# Patient Record
Sex: Male | Born: 1965 | Race: White | Hispanic: No | Marital: Married | Smoking: Never smoker
Health system: Southern US, Community
[De-identification: ages and names within clinical notes are randomized; demographics above are authoritative.]

## PROBLEM LIST (undated history)

## (undated) DIAGNOSIS — I10 Essential (primary) hypertension: Secondary | ICD-10-CM

## (undated) DIAGNOSIS — E119 Type 2 diabetes mellitus without complications: Secondary | ICD-10-CM

## (undated) DIAGNOSIS — E785 Hyperlipidemia, unspecified: Secondary | ICD-10-CM

## (undated) DIAGNOSIS — N200 Calculus of kidney: Secondary | ICD-10-CM

## (undated) DIAGNOSIS — R7989 Other specified abnormal findings of blood chemistry: Secondary | ICD-10-CM

## (undated) HISTORY — DX: Essential (primary) hypertension: I10

## (undated) HISTORY — DX: Other specified abnormal findings of blood chemistry: R79.89

## (undated) HISTORY — DX: Calculus of kidney: N20.0

## (undated) HISTORY — DX: Type 2 diabetes mellitus without complications: E11.9

## (undated) HISTORY — DX: Hyperlipidemia, unspecified: E78.5

---

## 2003-07-20 HISTORY — PX: OTHER SURGICAL HISTORY: SHX169

## 2008-02-22 ENCOUNTER — Ambulatory Visit: Payer: Self-pay | Admitting: Maternal & Fetal Medicine

## 2012-12-14 ENCOUNTER — Ambulatory Visit: Payer: Self-pay | Admitting: Internal Medicine

## 2012-12-22 DIAGNOSIS — R339 Retention of urine, unspecified: Secondary | ICD-10-CM | POA: Insufficient documentation

## 2012-12-22 DIAGNOSIS — N400 Enlarged prostate without lower urinary tract symptoms: Secondary | ICD-10-CM | POA: Insufficient documentation

## 2012-12-22 DIAGNOSIS — R5383 Other fatigue: Secondary | ICD-10-CM | POA: Insufficient documentation

## 2012-12-22 DIAGNOSIS — E291 Testicular hypofunction: Secondary | ICD-10-CM | POA: Insufficient documentation

## 2013-07-31 DIAGNOSIS — E236 Other disorders of pituitary gland: Secondary | ICD-10-CM | POA: Insufficient documentation

## 2013-12-04 DIAGNOSIS — G4733 Obstructive sleep apnea (adult) (pediatric): Secondary | ICD-10-CM | POA: Insufficient documentation

## 2014-02-07 DIAGNOSIS — E221 Hyperprolactinemia: Secondary | ICD-10-CM | POA: Insufficient documentation

## 2014-02-07 DIAGNOSIS — N200 Calculus of kidney: Secondary | ICD-10-CM | POA: Insufficient documentation

## 2016-10-28 ENCOUNTER — Encounter: Payer: Self-pay | Admitting: *Deleted

## 2016-11-02 ENCOUNTER — Encounter: Payer: Self-pay | Admitting: *Deleted

## 2016-11-08 ENCOUNTER — Ambulatory Visit (INDEPENDENT_AMBULATORY_CARE_PROVIDER_SITE_OTHER): Payer: BC Managed Care – PPO | Admitting: General Surgery

## 2016-11-08 ENCOUNTER — Encounter: Payer: Self-pay | Admitting: General Surgery

## 2016-11-08 VITALS — BP 140/78 | HR 82 | Resp 14 | Ht 66.0 in | Wt 201.0 lb

## 2016-11-08 DIAGNOSIS — Z1211 Encounter for screening for malignant neoplasm of colon: Secondary | ICD-10-CM | POA: Diagnosis not present

## 2016-11-08 MED ORDER — POLYETHYLENE GLYCOL 3350 17 GM/SCOOP PO POWD: ORAL | 0 refills | Status: DC

## 2016-11-08 NOTE — Patient Instructions (Signed)
Colonoscopy, Adult A colonoscopy is an exam to look at the entire large intestine. During the exam, a lubricated, bendable tube is inserted into the anus and then passed into the rectum, colon, and other parts of the large intestine. A colonoscopy is often done as a part of normal colorectal screening or in response to certain symptoms, such as anemia, persistent diarrhea, abdominal pain, and blood in the stool. The exam can help screen for and diagnose medical problems, including:  Tumors.  Polyps.  Inflammation.  Areas of bleeding. Tell a health care provider about:  Any allergies you have.  All medicines you are taking, including vitamins, herbs, eye drops, creams, and over-the-counter medicines.  Any problems you or family members have had with anesthetic medicines.  Any blood disorders you have.  Any surgeries you have had.  Any medical conditions you have.  Any problems you have had passing stool. What are the risks? Generally, this is a safe procedure. However, problems may occur, including:  Bleeding.  A tear in the intestine.  A reaction to medicines given during the exam.  Infection (rare). What happens before the procedure? Eating and drinking restrictions  Follow instructions from your health care provider about eating and drinking, which may include:  A few days before the procedure - follow a low-fiber diet. Avoid nuts, seeds, dried fruit, raw fruits, and vegetables.  1-3 days before the procedure - follow a clear liquid diet. Drink only clear liquids, such as clear broth or bouillon, black coffee or tea, clear juice, clear soft drinks or sports drinks, gelatin dessert, and popsicles. Avoid any liquids that contain red or purple dye.  On the day of the procedure - do not eat or drink anything during the 2 hours before the procedure, or within the time period that your health care provider recommends. Bowel prep  If you were prescribed an oral bowel prep to  clean out your colon:  Take it as told by your health care provider. Starting the day before your procedure, you will need to drink a large amount of medicated liquid. The liquid will cause you to have multiple loose stools until your stool is almost clear or light green.  If your skin or anus gets irritated from diarrhea, you may use these to relieve the irritation:  Medicated wipes, such as adult wet wipes with aloe and vitamin E.  A skin soothing-product like petroleum jelly.  If you vomit while drinking the bowel prep, take a break for up to 60 minutes and then begin the bowel prep again. If vomiting continues and you cannot take the bowel prep without vomiting, call your health care provider. General instructions   Ask your health care provider about changing or stopping your regular medicines. This is especially important if you are taking diabetes medicines or blood thinners.  Plan to have someone take you home from the hospital or clinic. What happens during the procedure?  An IV tube may be inserted into one of your veins.  You will be given medicine to help you relax (sedative).  To reduce your risk of infection:  Your health care team will wash or sanitize their hands.  Your anal area will be washed with soap.  You will be asked to lie on your side with your knees bent.  Your health care provider will lubricate a long, thin, flexible tube. The tube will have a camera and a light on the end.  The tube will be inserted into your anus.    The tube will be gently eased through your rectum and colon.  Air will be delivered into your colon to keep it open. You may feel some pressure or cramping.  The camera will be used to take images during the procedure.  A small tissue sample may be removed from your body to be examined under a microscope (biopsy). If any potential problems are found, the tissue will be sent to a lab for testing.  If small polyps are found, your health  care provider may remove them and have them checked for cancer cells.  The tube that was inserted into your anus will be slowly removed. The procedure may vary among health care providers and hospitals. What happens after the procedure?  Your blood pressure, heart rate, breathing rate, and blood oxygen level will be monitored until the medicines you were given have worn off.  Do not drive for 24 hours after the exam.  You may have a small amount of blood in your stool.  You may pass gas and have mild abdominal cramping or bloating due to the air that was used to inflate your colon during the exam.  It is up to you to get the results of your procedure. Ask your health care provider, or the department performing the procedure, when your results will be ready. This information is not intended to replace advice given to you by your health care provider. Make sure you discuss any questions you have with your health care provider. Document Released: 07/02/2000 Document Revised: 05/05/2016 Document Reviewed: 09/16/2015 Elsevier Interactive Patient Education  2017 [employer]vier Inc.  

## 2016-11-08 NOTE — Progress Notes (Signed)
Patient ID: Jeffrey Pace, male   DOB: 12-29-1965, 51 y.o.   MRN: [id number]  Chief Complaint  Patient presents with  . Colonoscopy    HPI Jeffrey Pace is a 51 y.o. male here for a colonoscopy discussion. He reports no GI problems. He moves his bowels daily. He has never had a colonoscopy before.  [name] is his uncle.  HPI  Past Medical History:  Diagnosis Date  . Diabetes mellitus (HCC)   . Hyperlipidemia   . Hypertension   . Kidney stones   . Low testosterone in male     Past Surgical History:  Procedure Laterality Date  . broken foot Left 2005   repair with rods    Family History  Problem Relation Age of Onset  . Lung cancer Mother 37  . Breast cancer Mother 85  . Hypertension Father   . Rectal cancer Maternal Uncle 64    Social History Social History  Substance Use Topics  . Smoking status: Never Smoker  . Smokeless tobacco: Never Used  . Alcohol use 2.4 oz/week    4 Glasses of wine per week    Allergies  Allergen Reactions  . Demerol [Meperidine] Nausea And Vomiting    Current Outpatient Prescriptions  Medication Sig Dispense Refill  . atorvastatin (LIPITOR) 20 MG tablet Take 20 mg by mouth daily.    . Cholecalciferol (VITAMIN D3) 10000 units TABS Take by mouth.    Marland Kitchen glipiZIDE (GLUCOTROL XL) 2.5 MG 24 hr tablet Take 2.5 mg by mouth daily with breakfast.    . losartan-hydrochlorothiazide (HYZAAR) 100-25 MG tablet Take 1 tablet by mouth daily.    . metFORMIN (GLUMETZA) 1000 MG (MOD) 24 hr tablet Take 1,000 mg by mouth daily with breakfast.    . testosterone cypionate (DEPO-TESTOSTERONE) 200 MG/ML injection Inject 200 mg into the muscle every 14 (fourteen) days.    . polyethylene glycol powder (GLYCOLAX/MIRALAX) powder 255 grams one bottle for colonoscopy prep 255 g 0   No current facility-administered medications for this visit.     Review of Systems Review of Systems  Constitutional: Negative.   Respiratory: Negative.   Cardiovascular:  Negative.     Blood pressure 140/78, pulse 82, resp. rate 14, height  (1.676 m), weight 201 lb (91.2 kg).  Physical Exam Physical Exam  Constitutional: He is oriented to person, place, and time. He appears well-developed and well-nourished.  Cardiovascular: Normal rate, regular rhythm and normal heart sounds.   Pulmonary/Chest: Effort normal and breath sounds normal.  Neurological: He is alert and oriented to person, place, and time.  Skin: Skin is warm and dry.    Data Reviewed PCP laboratory studies dated 08/03/2016 were reviewed. Hemoglobin 17.4, MCV 90, white blood cell count 6200, platelet count 213,000. Creatinine is 1.2 with an estimated GFR of 64. Stable for the last 2 years. Hemoglobin A1c 6.8.  Assessment    Candidate for screening colonoscopy.    Plan        Colonoscopy with possible biopsy/polypectomy prn: Information regarding the procedure, including its potential risks and complications (including but not limited to perforation of the bowel, which may require emergency surgery to repair, and bleeding) was verbally given to the patient. Educational information regarding lower intestinal endoscopy was given to the patient. Written instructions for how to complete the bowel prep using Miralax were provided. The importance of drinking ample fluids to avoid dehydration as a result of the prep emphasized.  HPI, Physical Exam, Assessment and Plan have  been scribed under the direction and in the presence of Donnalee Curry, MD.  Ples Specter, CMA  I have completed the exam and reviewed the above documentation for accuracy and completeness.  I agree with the above.  Museum/gallery conservator has been used and any errors in dictation or transcription are unintentional.  Donnalee Curry, M.D., F.A.C.S.  Earline Mayotte 11/09/2016, 8:12 AM   Patient has been scheduled for a colonoscopy on 01-05-17 at [facility]. This patient has been asked to hold metformin day of colonoscopy  prep and procedure. Miralax prescription has been sent in to the patient's pharmacy today. Colonoscopy instructions have been reviewed with the patient. This patient is aware to call the office if they have further questions.   Nicholes Mango, CMA

## 2016-11-09 ENCOUNTER — Encounter: Payer: Self-pay | Admitting: General Surgery

## 2016-11-09 DIAGNOSIS — Z1211 Encounter for screening for malignant neoplasm of colon: Secondary | ICD-10-CM | POA: Insufficient documentation

## 2016-12-29 ENCOUNTER — Telehealth: Payer: Self-pay | Admitting: *Deleted

## 2016-12-29 NOTE — Telephone Encounter (Signed)
Patient was contacted today and confirms no medication changes since his last office visit.   This patient reports that he has picked up Miralax prescription.  We will proceed with colon[facility] as [facility].   Patient was encouraged to call the office should he have further questions.

## 2017-01-05 ENCOUNTER — Ambulatory Visit
Admission: RE | Admit: 2017-01-05 | Discharge: 2017-01-05 | Disposition: A | Discharge status: Home / Self Care | Payer: BC Managed Care – PPO | Source: Ambulatory Visit | Attending: General Surgery | Admitting: General Surgery

## 2017-01-05 ENCOUNTER — Encounter
Admission: RE | Disposition: A | Discharge status: Home / Self Care | Payer: Self-pay | Source: Ambulatory Visit | Attending: General Surgery

## 2017-01-05 ENCOUNTER — Ambulatory Visit: Payer: BC Managed Care – PPO | Admitting: Anesthesiology

## 2017-01-05 DIAGNOSIS — E785 Hyperlipidemia, unspecified: Secondary | ICD-10-CM | POA: Diagnosis not present

## 2017-01-05 DIAGNOSIS — E119 Type 2 diabetes mellitus without complications: Secondary | ICD-10-CM | POA: Insufficient documentation

## 2017-01-05 DIAGNOSIS — Z1211 Encounter for screening for malignant neoplasm of colon: Secondary | ICD-10-CM | POA: Insufficient documentation

## 2017-01-05 DIAGNOSIS — Z79899 Other long term (current) drug therapy: Secondary | ICD-10-CM | POA: Insufficient documentation

## 2017-01-05 DIAGNOSIS — Z7982 Long term (current) use of aspirin: Secondary | ICD-10-CM | POA: Insufficient documentation

## 2017-01-05 DIAGNOSIS — Z7984 Long term (current) use of oral hypoglycemic drugs: Secondary | ICD-10-CM | POA: Insufficient documentation

## 2017-01-05 DIAGNOSIS — K5730 Diverticulosis of large intestine without perforation or abscess without bleeding: Secondary | ICD-10-CM | POA: Insufficient documentation

## 2017-01-05 DIAGNOSIS — I10 Essential (primary) hypertension: Secondary | ICD-10-CM | POA: Insufficient documentation

## 2017-01-05 HISTORY — PX: COLONOSCOPY WITH PROPOFOL: SHX5780

## 2017-01-05 LAB — GLUCOSE, CAPILLARY: GLUCOSE-CAPILLARY: 141 mg/dL — AB (ref 65–99)

## 2017-01-05 SURGERY — COLONOSCOPY WITH PROPOFOL
Anesthesia: General

## 2017-01-05 MED ORDER — SODIUM CHLORIDE 0.9 % IV SOLN
INTRAVENOUS | Status: DC
  Administered 2017-01-05: 1000 mL via INTRAVENOUS

## 2017-01-05 MED ORDER — PROPOFOL 500 MG/50ML IV EMUL
INTRAVENOUS | Status: AC
  Filled 2017-01-05: qty 50

## 2017-01-05 MED ORDER — PROPOFOL 500 MG/50ML IV EMUL
INTRAVENOUS | Status: DC | PRN
  Administered 2017-01-05: 120 ug/kg/min via INTRAVENOUS

## 2017-01-05 MED ORDER — PROPOFOL 10 MG/ML IV BOLUS
INTRAVENOUS | Status: DC | PRN
  Administered 2017-01-05: 100 mg via INTRAVENOUS

## 2017-01-05 MED ORDER — LIDOCAINE HCL (PF) 2 % IJ SOLN
INTRAMUSCULAR | Status: DC | PRN
  Administered 2017-01-05: 50 mg via INTRADERMAL

## 2017-01-05 MED ORDER — PHENYLEPHRINE HCL 10 MG/ML IJ SOLN
INTRAMUSCULAR | Status: DC | PRN
  Administered 2017-01-05: 100 ug via INTRAVENOUS

## 2017-01-05 NOTE — Anesthesia Postprocedure Evaluation (Signed)
Anesthesia Post Note  Patient: Jeffrey KoyanagiMichael T Pace  Procedure(s) Performed: Procedure(s) (LRB): COLONOSCOPY WITH PROPOFOL (N/A)  Patient location during evaluation: PACU Anesthesia Type: General Level of consciousness: awake Pain management: pain level controlled Vital Signs Assessment: post-procedure vital signs reviewed and stable Respiratory status: spontaneous breathing Cardiovascular status: stable Anesthetic complications: no     Last Vitals:  Vitals:   01/05/17 1030 01/05/17 1040  BP: 117/73 100/80  Pulse: 81 80  Resp: (!) 23 18  Temp:      Last Pain:  Vitals:   01/05/17 1010  TempSrc: Tympanic                 VAN STAVEREN,Averill Pons

## 2017-01-05 NOTE — H&P (Signed)
Jeffrey KoyanagiMichael T Pace [id number] 04-11-1966     HPI: 51 year old male for screening colonoscopy. He reports tolerating the prep well. Preprocedure blood sugar: 141.  Prescriptions Prior to Admission  Medication Sig Dispense Refill Last Dose  . aspirin EC 81 MG tablet Take 81 mg by mouth daily.   Past Week at Unknown time  . atorvastatin (LIPITOR) 20 MG tablet Take 20 mg by mouth daily.   Past Week at Unknown time  . Cholecalciferol (VITAMIN D3) 10000 units TABS Take by mouth.   Past Week at Unknown time  . glipiZIDE (GLUCOTROL XL) 2.5 MG 24 hr tablet Take 2.5 mg by mouth daily with breakfast.   Past Week at Unknown time  . losartan-hydrochlorothiazide (HYZAAR) 100-25 MG tablet Take 1 tablet by mouth daily.   01/05/2017 at 0745  . metFORMIN (GLUMETZA) 1000 MG (MOD) 24 hr tablet Take 1,000 mg by mouth daily with breakfast.   Past Week at Unknown time  . polyethylene glycol powder (GLYCOLAX/MIRALAX) powder 255 grams one bottle for colonoscopy prep 255 g 0 Past Week at Unknown time  . testosterone cypionate (DEPO-TESTOSTERONE) 200 MG/ML injection Inject 200 mg into the muscle every 14 (fourteen) days.   Taking   Allergies  Allergen Reactions  . Demerol [Meperidine] Nausea And Vomiting   Past Medical History:  Diagnosis Date  . Diabetes mellitus (HCC)   . Hyperlipidemia   . Hypertension   . Kidney stones   . Low testosterone in male    Past Surgical History:  Procedure Laterality Date  . broken foot Left 2005   repair with rods   Social History   Social History  . Marital status: Married    Spouse name: N/A  . Number of children: N/A  . Years of education: N/A   Occupational History  . Not on file.   Social History Main Topics  . Smoking status: Never Smoker  . Smokeless tobacco: Never Used  . Alcohol use 2.4 oz/week    4 Glasses of wine per week  . Drug use: No  . Sexual activity: Not on file   Other Topics Concern  . Not on file   Social History Narrative  . No  narrative on file   Social History   Social History Narrative  . No narrative on file     ROS: Negative.     PE: HEENT: Negative. Lungs: Clear. Cardio: RR.  Assessment/Plan:  Proceed with planned endoscopy.  Earline MayotteByrnett, Najat Olazabal W 01/05/2017

## 2017-01-05 NOTE — Anesthesia Preprocedure Evaluation (Signed)
Anesthesia Evaluation  Patient identified by MRN, date of birth, ID band Patient awake    Reviewed: Allergy & Precautions, NPO status , Patient's Chart, lab work & pertinent test results  Airway Mallampati: II       Dental  (+) Teeth Intact   Pulmonary neg pulmonary ROS,    breath sounds clear to auscultation       Cardiovascular Exercise Tolerance: Good hypertension, Pt. on medications  Rhythm:Regular Rate:Normal     Neuro/Psych negative neurological ROS  negative psych ROS   GI/Hepatic negative GI ROS, Neg liver ROS,   Endo/Other  diabetes, Type 2, Oral Hypoglycemic Agents  Renal/GU      Musculoskeletal negative musculoskeletal ROS (+)   Abdominal (+) + obese,   Peds negative pediatric ROS (+)  Hematology negative hematology ROS (+)   Anesthesia Other Findings   Reproductive/Obstetrics                             Anesthesia Physical Anesthesia Plan  ASA: II  Anesthesia Plan: General   Post-op Pain Management:    Induction: Intravenous  PONV Risk Score and Plan: 0  Airway Management Planned: Natural Airway and Nasal Cannula  Additional Equipment:   Intra-op Plan:   Post-operative Plan:   Informed Consent: I have reviewed the patients History and Physical, chart, labs and discussed the procedure including the risks, benefits and alternatives for the proposed anesthesia with the patient or authorized representative who has indicated his/her understanding and acceptance.     Plan Discussed with: CRNA  Anesthesia Plan Comments:         Anesthesia Quick Evaluation

## 2017-01-05 NOTE — Transfer of Care (Signed)
Immediate Anesthesia Transfer of Care Note  Patient: Jeffrey KoyanagiMichael T Pace  Procedure(s) Performed: Procedure(s): COLONOSCOPY WITH PROPOFOL (N/A)  Patient Location: PACU  Anesthesia Type:General  Level of Consciousness: drowsy  Airway & Oxygen Therapy: Patient connected to nasal cannula oxygen  Post-op Assessment: Report given to RN and Post -op Vital signs reviewed and stable  Post vital signs: Reviewed and stable  Last Vitals:  Vitals:   01/05/17 0900 01/05/17 1010  BP: 129/80 (!) 89/54  Pulse: 92 75  Resp: 16 16  Temp: 36.9 C (!) 35.7 C    Last Pain:  Vitals:   01/05/17 1010  TempSrc: Tympanic         Complications: No apparent anesthesia complications

## 2017-01-05 NOTE — Op Note (Signed)
[facility] Gastroenterology Patient Name: Jeffrey MariaMichael Matsen Procedure Date: 01/05/2017 9:40 AM MRN: [id number] Account #: [id number] Date of Birth: Sep 23, 1965 Admit Type: Outpatient Age: [age] Room[facility] ENDO ROOM 1 Gender: Male Note Status: Finalized Procedure:            Colonoscopy Indications:          Screening for colorectal malignant neoplasm Providers:            Earline MayotteJeffrey W. Ervin Rothbauer, MD Referring MD:         Hassell HalimMarcus E. Babaoff MD (Referring MD) Medicines:            Monitored Anesthesia Care Complications:        No immediate complications. Procedure:            Pre-Anesthesia Assessment:                       - Prior to the procedure, a History and Physical was                        performed, and patient medications, allergies and                        sensitivities were reviewed. The patient's tolerance of                        previous anesthesia was reviewed.                       - The risks and benefits of the procedure and the                        sedation options and risks were discussed with the                        patient. All questions were answered and informed                        consent was obtained.                       After obtaining informed consent, the colonoscope was                        passed under direct vision. Throughout the procedure,                        the patient's blood pressure, pulse, and oxygen                        saturations were monitored continuously. The                        Colonoscope was introduced through the anus and                        advanced to the the cecum, identified by appendiceal                        orifice and ileocecal valve. The colonoscopy was  somewhat difficult due to significant looping.                        Successful completion of the procedure was aided by                        using manual pressure. The patient tolerated the   procedure well. The quality of the bowel preparation                        was excellent. Findings:      Many small-mouthed diverticula were found in the sigmoid colon and       descending colon.      The retroflexed view of the distal rectum and anal verge was normal and       showed no anal or rectal abnormalities. Impression:           - Diverticulosis in the sigmoid colon and in the                        descending colon.                       - The distal rectum and anal verge are normal on                        retroflexion view.                       - No specimens collected. Recommendation:       - Repeat colonoscopy in 10 years for screening purposes.                       - Use fiber, for example Citrucel, Fibercon, Konsyl or                        Metamucil. Procedure Code(s):    --- Professional ---                       [id number], Colonoscopy, flexible; diagnostic, including                        collection of specimen(s) by brushing or washing, when                        performed (separate procedure) Diagnosis Code(s):    --- Professional ---                       Z12.11, Encounter for screening for malignant neoplasm                        of colon                       K57.30, Diverticulosis of large intestine without                        perforation or abscess without bleeding CPT copyright 2016 [employer]. All rights reserved. The codes documented in this report are preliminary and upon coder review may  be revised to meet current compliance requirements. Earline Mayotte, MD 01/05/2017  10:08:09 AM This report has been signed electronically. Number of Addenda: 0 Note Initiated On: 01/05/2017 9:40 AM Scope Withdrawal Time: 0 hours 10 minutes 38 seconds  Total Procedure Duration: 0 hours 17 minutes 25 seconds       [facility]

## 2017-01-05 NOTE — Anesthesia Post-op Follow-up Note (Cosign Needed)
Anesthesia QCDR form completed.        

## 2017-01-06 ENCOUNTER — Encounter: Payer: Self-pay | Admitting: General Surgery

## 2018-02-08 ENCOUNTER — Encounter

## 2018-02-08 ENCOUNTER — Ambulatory Visit: Payer: BC Managed Care – PPO | Admitting: Urology

## 2018-02-08 ENCOUNTER — Encounter: Payer: Self-pay | Admitting: Urology

## 2018-02-08 VITALS — BP 134/88 | HR 92 | Ht 66.0 in | Wt 191.2 lb

## 2018-02-08 DIAGNOSIS — E291 Testicular hypofunction: Secondary | ICD-10-CM

## 2018-02-08 NOTE — Progress Notes (Signed)
02/08/2018 12:58 PM   Jeffrey Pace Dec 17, 1965 [id number]  Referring provider: Kandyce Rud, MD [phone] [address] [facility] [place], [place] [address]  Chief Complaint  Patient presents with  . Establish Care    h/o kidney stones  . Hypogonadism    HPI: 52 year old male who presents to ED establish local urologic care.  He has previously seen Dr. Achilles Dunk for hypogonadism and erectile dysfunction.  He last saw him at [facility] in December 2018.  He is currently injecting 200 mg of testosterone cypionate every 2 weeks.  His last injection was on 01/27/2018.  He denies breast tenderness/enlargement or lower extremity edema.  He notes good energy level and libido.  He has no bothersome lower urinary tract symptoms.  At his last blood work in December 2018 his PSA had bumped from 1.34-2.86.  He is using generic sildenafil for erectile dysfunction with good efficacy.   PMH: Past Medical History:  Diagnosis Date  . Diabetes mellitus (HCC)   . Hyperlipidemia   . Hypertension   . Kidney stones   . Low testosterone in male     Surgical History: Past Surgical History:  Procedure Laterality Date  . broken foot Left 2005   repair with rods  . COLONOSCOPY WITH PROPOFOL N/A 01/05/2017   Procedure: COLONOSCOPY WITH PROPOFOL;  Surgeon: Earline Mayotte, MD;  Location: [facility] ENDOSCOPY;  Service: Endoscopy;  Laterality: N/A;    Home Medications:  Allergies as of 02/08/2018      Reactions   Demerol [meperidine] Nausea And Vomiting      Medication List        Accurate as of 02/08/18 12:58 PM. Always use your most recent med list.          aspirin EC 81 MG tablet Take 81 mg by mouth daily.   atorvastatin 20 MG tablet Commonly known as:  LIPITOR Take 20 mg by mouth daily.   DEPO-TESTOSTERONE 200 MG/ML injection Generic drug:  testosterone cypionate Inject 200 mg into the muscle every 14 (fourteen) days.   glipiZIDE 2.5 MG 24 hr  tablet Commonly known as:  GLUCOTROL XL Take 2.5 mg by mouth daily with breakfast.   losartan-hydrochlorothiazide 100-25 MG tablet Commonly known as:  HYZAAR Take 1 tablet by mouth daily.   metFORMIN 1000 MG (MOD) 24 hr tablet Commonly known as:  GLUMETZA Take 1,000 mg by mouth daily with breakfast.   polyethylene glycol powder powder Commonly known as:  GLYCOLAX/MIRALAX 255 grams one bottle for colonoscopy prep   sildenafil 20 MG tablet Commonly known as:  REVATIO TAKE 3-5 TABLETS BY MOUTH DAILY AS NEEDED   Vitamin D3 10000 units Tabs Take by mouth.       Allergies:  Allergies  Allergen Reactions  . Demerol [Meperidine] Nausea And Vomiting    Family History: Family History  Problem Relation Age of Onset  . Lung cancer Mother 59  . Breast cancer Mother 81  . Hypertension Father   . Rectal cancer Maternal Uncle 47    Social History:  reports that he has never smoked. He has never used smokeless tobacco. He reports that he drinks about 2.4 oz of alcohol per week. He reports that he does not use drugs.  ROS: UROLOGY Frequent Urination?: No Hard to postpone urination?: No Burning/pain with urination?: No Get up at night to urinate?: No Leakage of urine?: No Urine stream starts and stops?: No Trouble starting stream?: No Do you have to strain  to urinate?: No Blood in urine?: No Urinary tract infection?: No Sexually transmitted disease?: No Injury to kidneys or bladder?: No Painful intercourse?: No Weak stream?: No Erection problems?: Yes Penile pain?: No  Gastrointestinal Nausea?: No Vomiting?: No Indigestion/heartburn?: No Diarrhea?: No Constipation?: No  Constitutional Fever: No Night sweats?: No Weight loss?: No Fatigue?: No  Skin Skin rash/lesions?: No Itching?: No  Eyes Blurred vision?: No Double vision?: No  Ears/Nose/Throat Sore throat?: No Sinus problems?: No  Hematologic/Lymphatic Swollen glands?: No Easy bruising?:  No  Cardiovascular Leg swelling?: No Chest pain?: No  Respiratory Cough?: No Shortness of breath?: No  Endocrine Excessive thirst?: No  Musculoskeletal Back pain?: Yes Joint pain?: Yes  Neurological Headaches?: No Dizziness?: No  Psychologic Depression?: No Anxiety?: No  Physical Exam: BP 134/88 (BP Location: Left Arm, Patient Position: Sitting, Cuff Size: Large)   Pulse 92   Ht 5\' 6"  (1.676 m)   Wt 191 lb 3.2 oz (86.7 kg)   BMI 30.86 kg/m   Constitutional:  Alert and oriented, No acute distress. HEENT: [place] AT, moist mucus membranes.  Trachea midline, no masses. Cardiovascular: No clubbing, cyanosis, or edema. Respiratory: Normal respiratory effort, no increased work of breathing. GI: Abdomen is soft, nontender, nondistended, no abdominal masses GU: No CVA tenderness.  Prostate 30 g, smooth without nodules. Lymph: No cervical or inguinal lymphadenopathy. Skin: No rashes, bruises or suspicious lesions. Neurologic: Grossly intact, no focal deficits, moving all 4 extremities. Psychiatric: Normal mood and affect.   Assessment & Plan:   52 year old male with hypogonadism on TRT with symptomatic improvement.  He desires to continue.  Monitoring blood work was drawn including a PSA, testosterone and hematocrit.  His most recent PSA had a significant bump and is still abnormal would recommend further evaluation with either biopsy or prostate MRI.  Stable erectile dysfunction on sildenafil.  Return in about 1 year (around 02/09/2019) for Recheck.   Riki AltesScott C Stoioff, MD  [facility] [address], [address] [place], [place] [address] [phone]

## 2018-02-09 LAB — TESTOSTERONE: Testosterone: 517 ng/dL (ref 264–916)

## 2018-02-09 LAB — PSA: PROSTATE SPECIFIC AG, SERUM: 1.7 ng/mL (ref 0.0–4.0)

## 2018-02-09 LAB — HEMATOCRIT: HEMATOCRIT: 54 % — AB (ref 37.5–51.0)

## 2018-02-13 ENCOUNTER — Telehealth: Payer: Self-pay

## 2018-02-13 NOTE — Telephone Encounter (Signed)
Pt informed, please call and schedule hematocrit and testosterone for 2 months.

## 2018-02-13 NOTE — Telephone Encounter (Signed)
-----   Message from Riki AltesScott C Stoioff, MD sent at 02/12/2018  9:51 AM EDT ----- Testosterone level was 517.  PSA back to baseline at 1.7.  Hematocrit is elevated at 54%.  Would recommend he donate blood.  Repeat hematocrit and testosterone level 2 months

## 2018-02-16 ENCOUNTER — Telehealth: Payer: Self-pay | Admitting: Urology

## 2018-02-16 NOTE — Telephone Encounter (Signed)
App made patient has been notified  Jeffrey Pace

## 2018-04-17 ENCOUNTER — Other Ambulatory Visit: Payer: Self-pay | Admitting: Family Medicine

## 2018-04-17 DIAGNOSIS — E291 Testicular hypofunction: Secondary | ICD-10-CM

## 2018-04-19 ENCOUNTER — Other Ambulatory Visit: Payer: BC Managed Care – PPO

## 2018-04-19 DIAGNOSIS — E291 Testicular hypofunction: Secondary | ICD-10-CM

## 2018-04-20 LAB — HEMATOCRIT: Hematocrit: 51.3 % — ABNORMAL HIGH (ref 37.5–51.0)

## 2018-04-20 LAB — TESTOSTERONE: TESTOSTERONE: 535 ng/dL (ref 264–916)

## 2018-04-21 ENCOUNTER — Telehealth: Payer: Self-pay | Admitting: Family Medicine

## 2018-04-21 NOTE — Telephone Encounter (Signed)
-----   Message from Riki Altes, MD sent at 04/21/2018  7:52 AM EDT ----- Hematocrit still elevated at 51.3.  Did he donate blood?.  Testosterone level was 535.

## 2018-04-21 NOTE — Telephone Encounter (Signed)
Patient notified, he states he has not been able to donate blood. The drives are at inconvenient times for his work schedule, he states he will continue to look.

## 2018-04-21 NOTE — Telephone Encounter (Signed)
Patient notified and he states he will just call us back if he can't get it done in the next couple weeks. He states he will be out of town next week.

## 2018-04-21 NOTE — Telephone Encounter (Signed)
I will not be able to refill his testosterone until his hematocrit is at an acceptable level.  We can refer him to hematology for phlebotomy.

## 2018-05-16 ENCOUNTER — Other Ambulatory Visit: Payer: Self-pay | Admitting: Family Medicine

## 2018-05-16 DIAGNOSIS — E291 Testicular hypofunction: Secondary | ICD-10-CM

## 2018-05-18 ENCOUNTER — Other Ambulatory Visit: Payer: BC Managed Care – PPO

## 2018-05-18 DIAGNOSIS — E291 Testicular hypofunction: Secondary | ICD-10-CM

## 2018-05-19 ENCOUNTER — Telehealth: Payer: Self-pay | Admitting: Family Medicine

## 2018-05-19 ENCOUNTER — Other Ambulatory Visit: Payer: Self-pay | Admitting: Urology

## 2018-05-19 LAB — HEMATOCRIT: Hematocrit: 45.5 % (ref 37.5–51.0)

## 2018-05-19 MED ORDER — "NEEDLE (DISP) 18G X 1"" MISC": 1.0000 mL | 0 refills | Status: DC

## 2018-05-19 MED ORDER — "SYRINGE 21G X 1-1/2"" 3 ML MISC": 1.0000 mL | 0 refills | Status: DC

## 2018-05-19 MED ORDER — TESTOSTERONE CYPIONATE 200 MG/ML IM SOLN: 200.0000 mg | INTRAMUSCULAR | 0 refills | Status: DC

## 2018-05-19 NOTE — Telephone Encounter (Signed)
-----   Message from Riki Altes, MD sent at 05/19/2018  7:06 AM EDT ----- Hematocrit normal at 45.5.  Testosterone was refilled.

## 2018-05-19 NOTE — Telephone Encounter (Signed)
-----   Message from Scott C Stoioff, MD sent at 05/19/2018  7:06 AM EDT ----- Hematocrit normal at 45.5.  Testosterone was refilled. 

## 2018-05-19 NOTE — Telephone Encounter (Signed)
Patient notified and needles and syringes were faxed to pharmacy.

## 2018-08-15 ENCOUNTER — Other Ambulatory Visit: Payer: Self-pay

## 2018-08-15 DIAGNOSIS — E291 Testicular hypofunction: Secondary | ICD-10-CM

## 2018-08-16 ENCOUNTER — Other Ambulatory Visit: Payer: BC Managed Care – PPO

## 2018-08-16 DIAGNOSIS — E291 Testicular hypofunction: Secondary | ICD-10-CM

## 2018-08-17 LAB — HEMATOCRIT: Hematocrit: 50.9 % (ref 37.5–51.0)

## 2018-08-17 LAB — PSA: PROSTATE SPECIFIC AG, SERUM: 1.7 ng/mL (ref 0.0–4.0)

## 2018-08-17 LAB — TESTOSTERONE: Testosterone: 1087 ng/dL — ABNORMAL HIGH (ref 264–916)

## 2018-08-21 ENCOUNTER — Telehealth: Payer: Self-pay

## 2018-08-21 NOTE — Telephone Encounter (Signed)
Called pt, left detailed message asking pt to call us back with the date of his last injection.

## 2018-08-21 NOTE — Telephone Encounter (Signed)
-----   Message from Riki Altes, MD sent at 08/21/2018  1:06 AM EST ----- Testosterone level was elevated at 1087.  When was his last injection in relation to this blood draw?

## 2018-08-22 NOTE — Telephone Encounter (Signed)
Pt returned the call from [place] and states that his last injection was 08/12/2018 or 08/13/2018, he wasn't exactly sure.

## 2018-11-01 ENCOUNTER — Other Ambulatory Visit: Payer: Self-pay | Admitting: *Deleted

## 2018-11-02 ENCOUNTER — Telehealth: Payer: Self-pay

## 2018-11-02 ENCOUNTER — Other Ambulatory Visit: Payer: Self-pay | Admitting: *Deleted

## 2018-11-02 DIAGNOSIS — E291 Testicular hypofunction: Secondary | ICD-10-CM

## 2018-11-02 MED ORDER — TESTOSTERONE CYPIONATE 200 MG/ML IM SOLN: 200.0000 mg | INTRAMUSCULAR | 0 refills | Status: DC

## 2018-11-02 NOTE — Telephone Encounter (Signed)
A limited refill was sent due to patient needing blood work a [employer] geologist was sent to patient to notify he needs to make a testosterone lab draw 1-2days prior to next injection for a trough level

## 2018-11-02 NOTE — Telephone Encounter (Signed)
-----   Message from Riki Altes, MD sent at 11/02/2018  9:01 AM EDT ----- Limited testosterone refill was sent.  He needs a follow-up trough testosterone level.

## 2018-11-02 NOTE — Telephone Encounter (Signed)
Pt states RX was ready and it should be 10 ml instead of 27ml for testosterone.

## 2018-11-02 NOTE — Progress Notes (Signed)
TE

## 2018-11-06 ENCOUNTER — Other Ambulatory Visit: Payer: Self-pay

## 2018-11-06 ENCOUNTER — Other Ambulatory Visit: Payer: BC Managed Care – PPO

## 2018-11-06 DIAGNOSIS — E291 Testicular hypofunction: Secondary | ICD-10-CM

## 2018-11-07 ENCOUNTER — Other Ambulatory Visit: Payer: Self-pay

## 2018-11-07 LAB — TESTOSTERONE: Testosterone: 195 ng/dL — ABNORMAL LOW (ref 264–916)

## 2018-11-07 NOTE — Telephone Encounter (Signed)
-----   Message from Riki Altes, MD sent at 11/07/2018  7:12 AM EDT ----- Repeat testosterone level is low at 195.  When was this blood draw in relation to his last injection and had he missed any recent doses?

## 2018-11-07 NOTE — Telephone Encounter (Signed)
Patient states his last injection was 2 weeks ago this past Sunday (around 10/22/18). He states he held off on injection so he could have labs drawn on Monday.

## 2018-11-07 NOTE — Telephone Encounter (Signed)
He can continue present dose.  He has a July follow-up.

## 2018-11-07 NOTE — Telephone Encounter (Signed)
Left message for patient to return call.

## 2018-11-08 MED ORDER — TESTOSTERONE CYPIONATE 200 MG/ML IM SOLN: 200.0000 mg | INTRAMUSCULAR | 0 refills | Status: DC

## 2018-11-08 NOTE — Telephone Encounter (Signed)
Pt needs a 10 ml vial of testosterone which is $15 instead of 1 ml vials which are $5 each.  This is his regular dosage.  He says it wasn't an issue of the dosage, it was the 10 ml vial vs the 1 ml vials.  He uses Therapist, occupational in [place].

## 2018-11-09 NOTE — Progress Notes (Unsigned)
Pt called back and wants to know is there a reason he can't get (1) 10 ml vial, instead of (10) 1 ml vials?  He has been doing a 10 ml vial for years.

## 2019-02-06 ENCOUNTER — Ambulatory Visit (INDEPENDENT_AMBULATORY_CARE_PROVIDER_SITE_OTHER): Payer: BC Managed Care – PPO | Admitting: Urology

## 2019-02-06 VITALS — BP 148/89 | HR 96 | Ht 66.0 in | Wt 190.8 lb

## 2019-02-06 DIAGNOSIS — R300 Dysuria: Secondary | ICD-10-CM

## 2019-02-06 DIAGNOSIS — G43109 Migraine with aura, not intractable, without status migrainosus: Secondary | ICD-10-CM | POA: Insufficient documentation

## 2019-02-06 DIAGNOSIS — E785 Hyperlipidemia, unspecified: Secondary | ICD-10-CM | POA: Insufficient documentation

## 2019-02-06 DIAGNOSIS — R310 Gross hematuria: Secondary | ICD-10-CM

## 2019-02-06 DIAGNOSIS — E291 Testicular hypofunction: Secondary | ICD-10-CM

## 2019-02-06 DIAGNOSIS — N400 Enlarged prostate without lower urinary tract symptoms: Secondary | ICD-10-CM | POA: Diagnosis not present

## 2019-02-06 DIAGNOSIS — I10 Essential (primary) hypertension: Secondary | ICD-10-CM | POA: Insufficient documentation

## 2019-02-06 DIAGNOSIS — E119 Type 2 diabetes mellitus without complications: Secondary | ICD-10-CM | POA: Insufficient documentation

## 2019-02-06 DIAGNOSIS — J309 Allergic rhinitis, unspecified: Secondary | ICD-10-CM | POA: Insufficient documentation

## 2019-02-06 LAB — BLADDER SCAN AMB NON-IMAGING

## 2019-02-06 MED ORDER — SILDENAFIL CITRATE 20 MG PO TABS: ORAL_TABLET | ORAL | 11 refills | Status: DC

## 2019-02-06 MED ORDER — SILDENAFIL CITRATE 20 MG PO TABS: ORAL_TABLET | ORAL | 2 refills | Status: DC

## 2019-02-06 MED ORDER — SULFAMETHOXAZOLE-TRIMETHOPRIM 800-160 MG PO TABS: 1.0000 | ORAL_TABLET | Freq: Two times a day (BID) | ORAL | 0 refills | Status: AC

## 2019-02-06 NOTE — Progress Notes (Signed)
02/06/2019 3:37 PM   Jeffrey Pace Jun 10, 1966 [id number]  Referring provider: Derinda Late, MD [phone] [address] [place] [facility] [place],  [place] [address]  Chief Complaint  Patient presents with  . Hematuria  . Hypogonadism    HPI: 53 year old male scheduled today for annual follow-up of hypogonadism and erectile dysfunction however he presented today with a new problem he desired to address.  On 7/19 he had onset of gross hematuria and urinary frequency with voiding every 20-30 minutes.  He stated his urine was dark red with clots.  He had gradual clearing of his urine but yesterday had onset of mild dysuria.  He denied flank, abdominal, pelvic or scrotal pain.  He remains on testosterone 200 mg every 14 days.  He is due for an injection this Friday.  He remains on generic sildenafil for ED with good efficacy.   PMH: Past Medical History:  Diagnosis Date  . Diabetes mellitus ([place])   . Hyperlipidemia   . Hypertension   . Kidney stones   . Low testosterone in male     Surgical History: Past Surgical History:  Procedure Laterality Date  . broken foot Left 2005   repair with rods  . COLONOSCOPY WITH PROPOFOL N/A 01/05/2017   Procedure: COLONOSCOPY WITH PROPOFOL;  Surgeon: Robert Bellow, MD;  Location: [facility] ENDOSCOPY;  Service: Endoscopy;  Laterality: N/A;    Home Medications:  Allergies as of 02/06/2019      Reactions   Demerol [meperidine] Nausea And Vomiting   Glipizide    Other reaction(s): Muscle Pain Did fine on 2.5 mg of glipizide but didn't tolerate 5 mg      Medication List       Accurate as of February 06, 2019  3:37 PM. If you have any questions, ask your nurse or doctor.        STOP taking these medications   glipiZIDE 2.5 MG 24 hr tablet Commonly known as: GLUCOTROL XL     TAKE these medications   aspirin EC 81 MG tablet Take 81 mg by mouth daily.   atorvastatin 20 MG tablet Commonly known as: LIPITOR  Take 20 mg by mouth daily.   hydrochlorothiazide 25 MG tablet Commonly known as: HYDRODIURIL TAKE 1 TABLET BY MOUTH EVERY DAY   losartan 100 MG tablet Commonly known as: COZAAR TAKE 1 TABLET BY MOUTH EVERY DAY   losartan-hydrochlorothiazide 100-25 MG tablet Commonly known as: HYZAAR Take 1 tablet by mouth daily.   metFORMIN 500 MG 24 hr tablet Commonly known as: GLUCOPHAGE-XR TK 2 TS PO D WITH DINNER What changed: Another medication with the same name was removed. Continue taking this medication, and follow the directions you see here.   NEEDLE (DISP) 18 G 18G X 1" Misc Commonly known as: B-D DISP NEEDLE TW 18GX1" 1 mL by Does not apply route every 14 (fourteen) days.   polyethylene glycol powder 17 GM/SCOOP powder Commonly known as: GLYCOLAX/MIRALAX 255 grams one bottle for colonoscopy prep   sildenafil 20 MG tablet Commonly known as: REVATIO TAKE 3-5 TABLETS BY MOUTH DAILY AS NEEDED   SYRINGE 3CC/21GX1-1/2" 21G X 1-1/2" 3 ML Misc 1 mL by Does not apply route every 14 (fourteen) days.   testosterone cypionate 200 MG/ML injection Commonly known as: Depo-Testosterone Inject 1 mL (200 mg total) into the muscle every 14 (fourteen) days.   Vitamin D3 250 MCG (10000 UT) Tabs Take by mouth.       Allergies:  Allergies  Allergen Reactions  . Demerol [Meperidine] Nausea And Vomiting  . Glipizide     Other reaction(s): Muscle Pain Did fine on 2.5 mg of glipizide but didn't tolerate 5 mg    Family History: Family History  Problem Relation Age of Onset  . Lung cancer Mother 9770  . Breast cancer Mother 5967  . Hypertension Father   . Rectal cancer Maternal Uncle 3048    Social History:  reports that he has never smoked. He has never used smokeless tobacco. He reports current alcohol use of about 4.0 standard drinks of alcohol per week. He reports that he does not use drugs.  ROS: UROLOGY Frequent Urination?: Yes Hard to postpone urination?: No Burning/pain with  urination?: Yes Get up at night to urinate?: No Leakage of urine?: No Urine stream starts and stops?: No Trouble starting stream?: No Do you have to strain to urinate?: No Blood in urine?: Yes Urinary tract infection?: No Sexually transmitted disease?: No Injury to kidneys or bladder?: No Painful intercourse?: No Weak stream?: No Erection problems?: No Penile pain?: No  Gastrointestinal Nausea?: No Vomiting?: No Indigestion/heartburn?: No Diarrhea?: No Constipation?: No  Constitutional Fever: No Night sweats?: No Weight loss?: No Fatigue?: No  Skin Skin rash/lesions?: No Itching?: No  Eyes Blurred vision?: No Double vision?: No  Ears/Nose/Throat Sore throat?: No Sinus problems?: No  Hematologic/Lymphatic Swollen glands?: No Easy bruising?: No  Cardiovascular Leg swelling?: No Chest pain?: No  Respiratory Cough?: No Shortness of breath?: No  Endocrine Excessive thirst?: No  Musculoskeletal Back pain?: No Joint pain?: No  Neurological Headaches?: No Dizziness?: No  Psychologic Depression?: No Anxiety?: No  Physical Exam: BP (!) 148/89 (BP Location: Left Arm, Patient Position: Sitting, Cuff Size: Normal)   Pulse 96   Ht 5\' 6"  (1.676 m)   Wt 190 lb 12.8 oz (86.5 kg)   BMI 30.80 kg/m   Constitutional:  Alert and oriented, No acute distress. HEENT: [place] AT, moist mucus membranes.  Trachea midline, no masses. Cardiovascular: No clubbing, cyanosis, or edema. Respiratory: Normal respiratory effort, no increased work of breathing. GI: Abdomen is soft, nontender, nondistended, no abdominal masses GU: No CVA tenderness Lymph: No cervical or inguinal lymphadenopathy. Skin: No rashes, bruises or suspicious lesions. Neurologic: Grossly intact, no focal deficits, moving all 4 extremities. Psychiatric: Normal mood and affect.  Laboratory Data:  Urinalysis Dipstick cloudy, nitrite+, 3+ leukocytes, 2+ blood Microscopy >30 WBC, negative RBC    Assessment & Plan:   53 year old male with new onset gross hematuria and dysuria.  Urinalysis today is nitrite positive with significant pyuria.  Urine culture was ordered and Rx Septra DS was sent to his pharmacy.  Although his hematuria may be due to infection recommend further evaluation with CT urogram.  Will await on culture results and CT prior to cystoscopy.  Stable hypogonadism on TRT.  Sildenafil was refilled  Testosterone and hematocrit drawn  Follow-up 6 months for labs including PSA and visit for DRE.   Riki AltesScott C Stoioff, MD  [facility] [address], [address] [place], [place] [address] [phone]

## 2019-02-07 LAB — URINALYSIS, COMPLETE
Bilirubin, UA: NEGATIVE
Glucose, UA: NEGATIVE
Ketones, UA: NEGATIVE
Nitrite, UA: POSITIVE — AB
Specific Gravity, UA: 1.02 (ref 1.005–1.030)
Urobilinogen, Ur: 0.2 mg/dL (ref 0.2–1.0)
pH, UA: 6 (ref 5.0–7.5)

## 2019-02-07 LAB — MICROSCOPIC EXAMINATION
RBC, Urine: NONE SEEN /hpf (ref 0–2)
WBC, UA: >30 /hpf — AB (ref 0–5)

## 2019-02-07 LAB — TESTOSTERONE: Testosterone: 598 ng/dL (ref 264–916)

## 2019-02-07 LAB — HEMATOCRIT: Hematocrit: 47.8 % (ref 37.5–51.0)

## 2019-02-08 ENCOUNTER — Encounter: Payer: Self-pay | Admitting: Urology

## 2019-02-08 ENCOUNTER — Telehealth: Payer: Self-pay | Admitting: *Deleted

## 2019-02-08 LAB — CULTURE, URINE COMPREHENSIVE

## 2019-02-08 NOTE — Telephone Encounter (Signed)
Notified patient as instructed, Testosterone level looks good at 598. Hematocrit was normal. Urine culture pending. patient pleased. Discussed follow-up appointments, patient agrees

## 2019-02-09 ENCOUNTER — Other Ambulatory Visit: Payer: Self-pay | Admitting: Urology

## 2019-02-09 ENCOUNTER — Telehealth: Payer: Self-pay

## 2019-02-09 DIAGNOSIS — R310 Gross hematuria: Secondary | ICD-10-CM

## 2019-02-09 NOTE — Telephone Encounter (Signed)
-----   Message from Abbie Sons, MD sent at 02/09/2019  7:18 AM EDT ----- Urine culture positive and sensitive to prescribed antibiotic.  Radiology should be calling about scheduling the CT.

## 2019-02-09 NOTE — Telephone Encounter (Signed)
Called pt informed him of the information below. Pt gave verbal understanding.  

## 2019-03-14 ENCOUNTER — Other Ambulatory Visit: Payer: Self-pay

## 2019-03-14 ENCOUNTER — Ambulatory Visit
Admission: RE | Admit: 2019-03-14 | Discharge: 2019-03-14 | Disposition: A | Discharge status: Home / Self Care | Payer: BC Managed Care – PPO | Source: Ambulatory Visit | Attending: Urology | Admitting: Urology

## 2019-03-14 DIAGNOSIS — R310 Gross hematuria: Secondary | ICD-10-CM

## 2019-03-14 IMAGING — CT CT ABDOMEN AND PELVIS WITHOUT AND WITH CONTRAST
3 of 12 series · 12 of 46 positions shown, 18 images · IV contrast (omnipaque)
Comparison: None.

CLINICAL DATA: Gross hematuria.

EXAM:
CT ABDOMEN AND PELVIS WITHOUT AND WITH CONTRAST
TECHNIQUE: Multidetector CT imaging of the abdomen and pelvis was performed
following the standard protocol before and following the bolus
administration of intravenous contrast.
CONTRAST:  125mL OMNIPAQUE IOHEXOL 300 MG/ML  SOLN

[Series 2: axial without pre · axial · non-contrast · 0.77mm/px · z∈[-1494,-1104]mm · 7 of 105 slices shown, 12 images]
[im 14/105  soft-tissue]
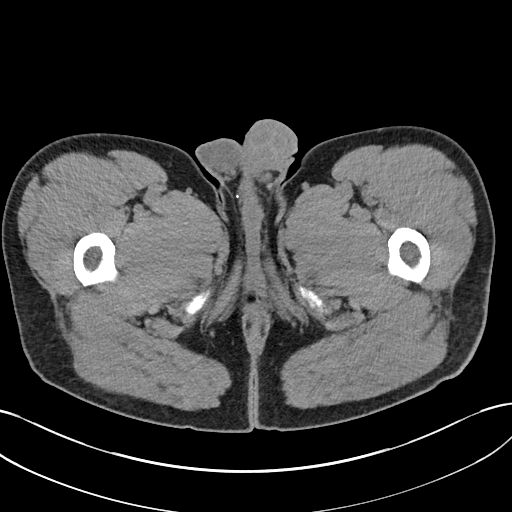
[im 14/105  bone]
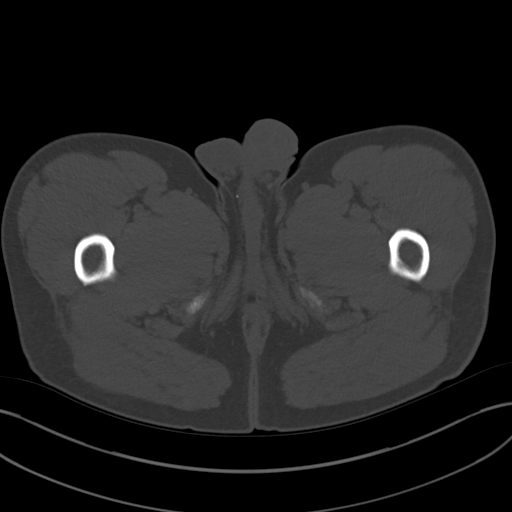
[im 27/105  soft-tissue]
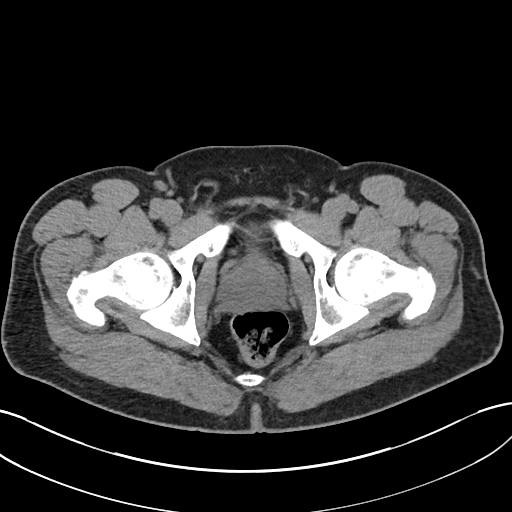
[im 40/105  soft-tissue]
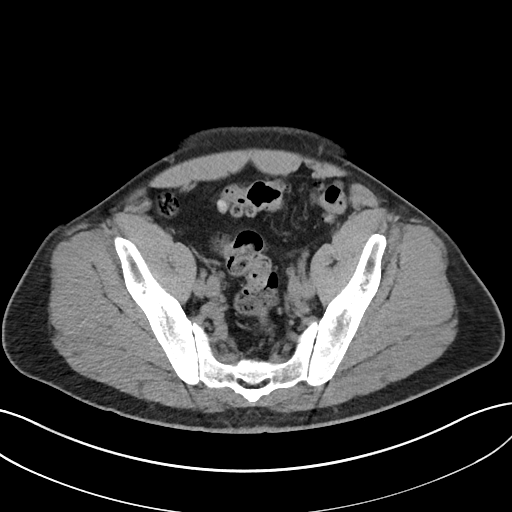
[im 53/105  soft-tissue]
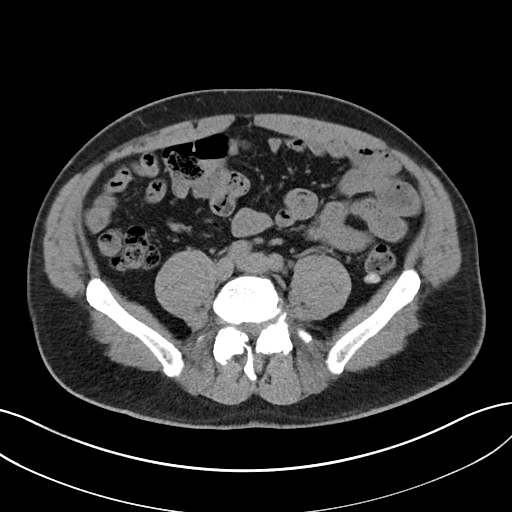
[im 53/105  lung]
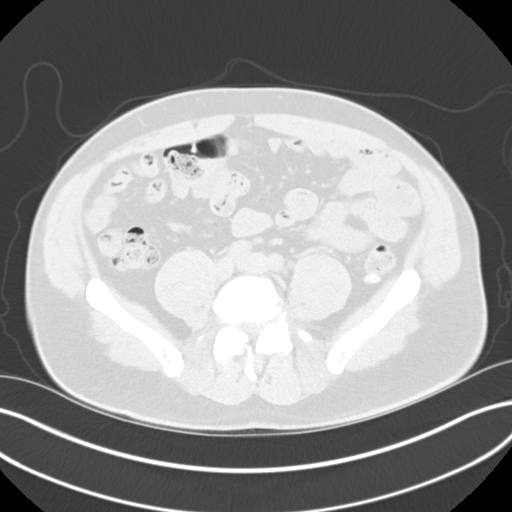
[im 66/105  soft-tissue]
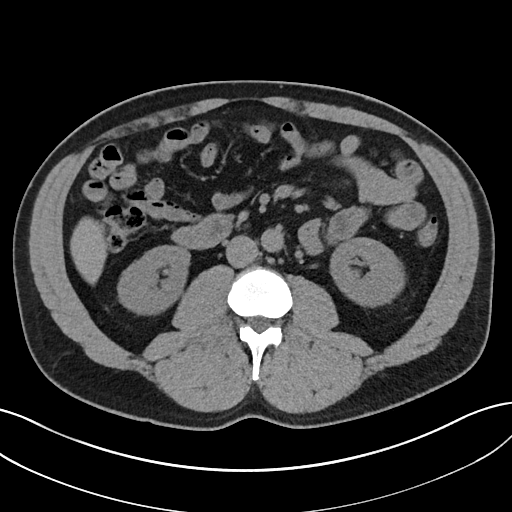
[im 66/105  lung]
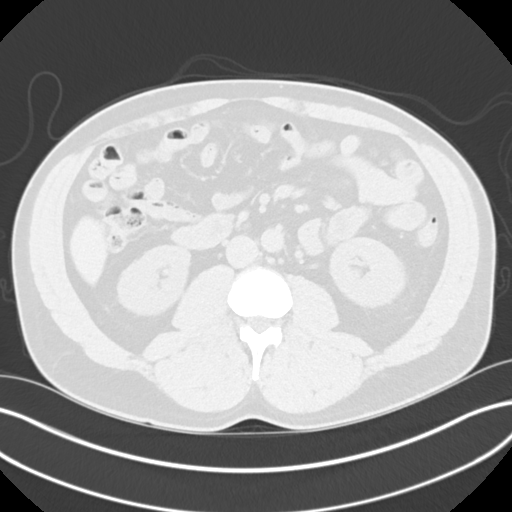
[im 79/105  soft-tissue]
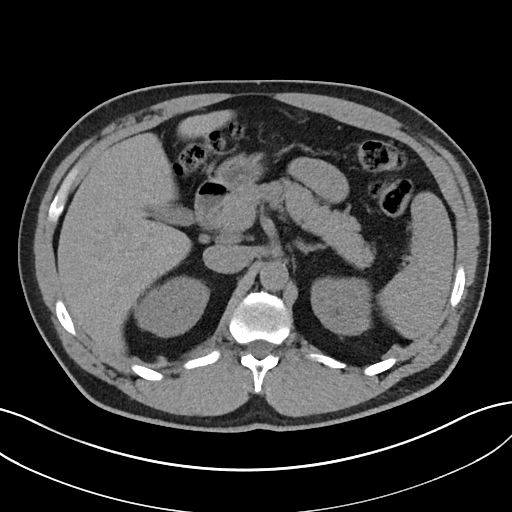
[im 79/105  lung]
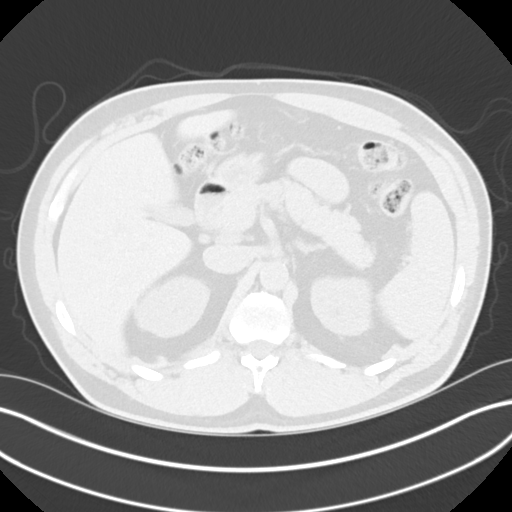
[im 92/105  soft-tissue]
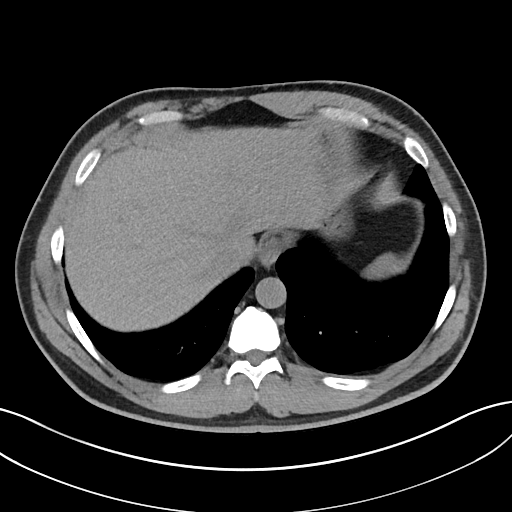
[im 92/105  lung]
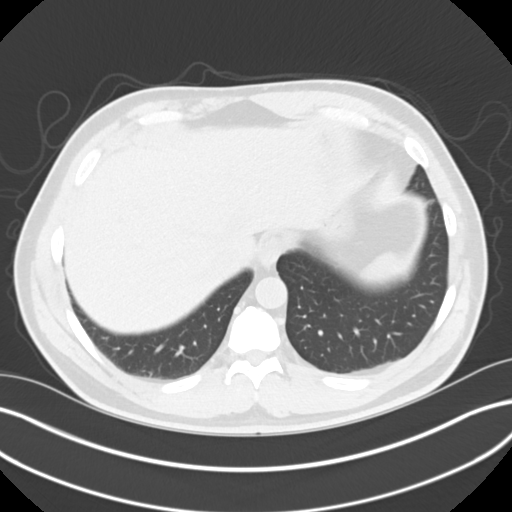

[Series 9: axial hematuria with · axial · 0.77mm/px · z∈[-1489,-1339]mm · 3 of 105 slices shown]
[im 15/105  soft-tissue]
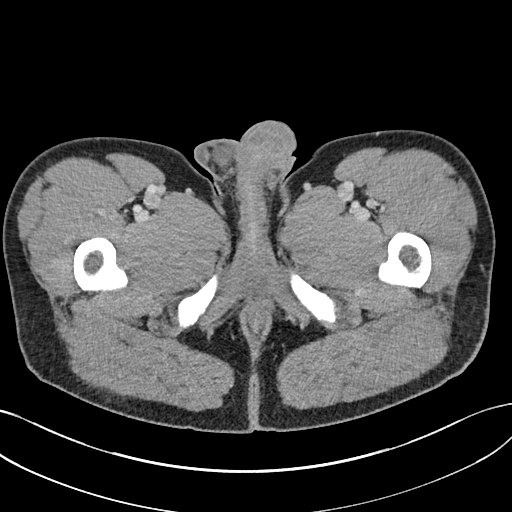
[im 30/105  soft-tissue]
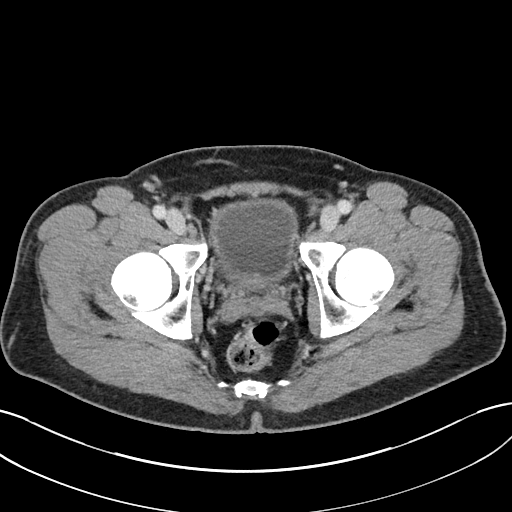
[im 45/105  soft-tissue]
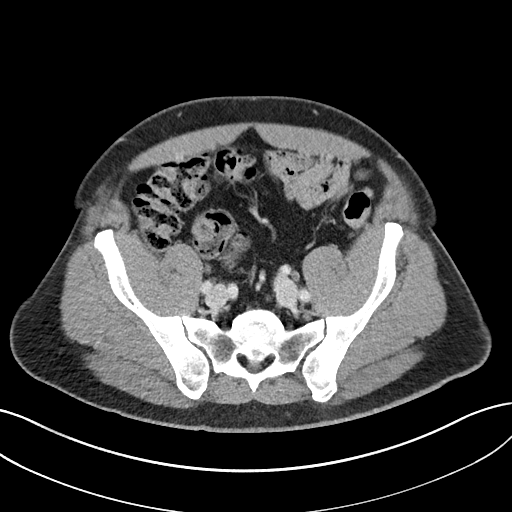

[Series 20: coronal delay prone · coronal · delayed · 0.74mm/px · 2 of 153 slices shown, 3 images]
[im 51/153  soft-tissue]
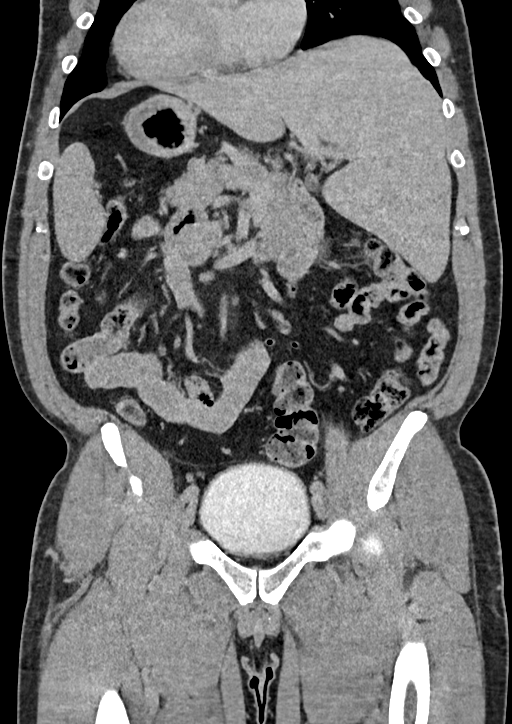
[im 51/153  bone]
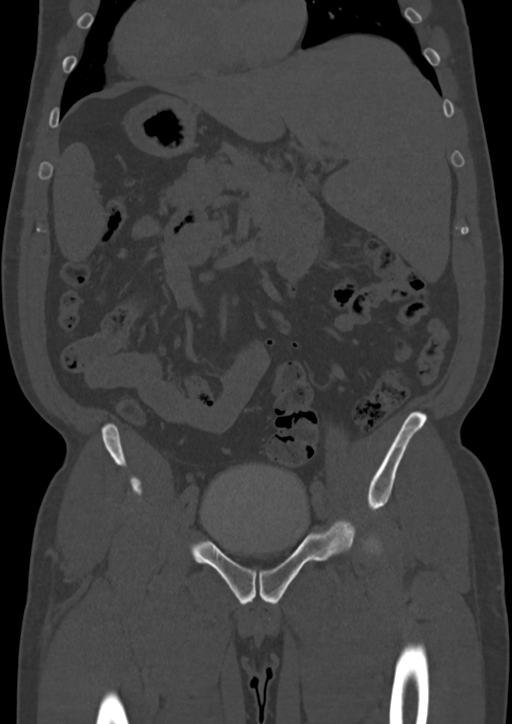
[im 102/153  soft-tissue]
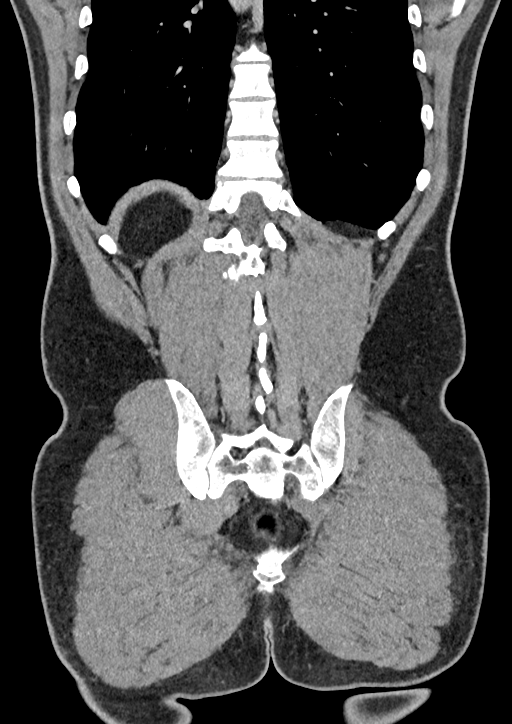

[12 of 46 positions shown; findings below may reference images not displayed]

FINDINGS: Lower chest: Lung bases are clear. Heart size normal. No pericardial
or pleural effusion. Distal esophagus is unremarkable.

Hepatobiliary: Liver and gallbladder are unremarkable. No biliary
ductal dilatation.

Pancreas: Negative.

Spleen: Negative.

Adrenals/Urinary Tract: Adrenal glands are unremarkable. No ureteral
stones. Subcentimeter low-attenuation lesions in the kidneys are too
small to characterize but cysts are most likely. Mid right ureter is
poorly opacified, limiting evaluation. Otherwise, no filling defects
the visualized portions of the intrarenal collecting systems,
ureters or bladder.

Stomach/Bowel: Stomach, small bowel appendix and colon are
unremarkable.

Vascular/Lymphatic: Atherosclerotic calcification of the aorta is
mild. No aneurysm. Left inguinal lymph node measures 1.3 cm and has
a fatty hilum.

Reproductive: Prostate is mildly enlarged.

Other: No free fluid.  Mesenteries and peritoneum are unremarkable.

Musculoskeletal: Advanced degenerative disc disease at L4-5.
Probable bone island in the inferior endplate of L3. No worrisome
lytic or sclerotic lesions.
IMPRESSION: 1. Mid right ureter is poorly opacified, limiting evaluation. Mildly
enlarged prostate. No additional findings to explain the patient's
hematuria.
2.  Aortic atherosclerosis ([DL]-170.0).

## 2019-03-14 MED ORDER — IOHEXOL 300 MG/ML  SOLN
125.0000 mL | Freq: Once | INTRAMUSCULAR | Status: AC | PRN
  Administered 2019-03-14: 125 mL via INTRAVENOUS

## 2019-03-20 ENCOUNTER — Telehealth: Payer: Self-pay | Admitting: Urology

## 2019-03-20 NOTE — Telephone Encounter (Signed)
-----   Message from Abbie Sons, MD sent at 03/20/2019  7:42 AM EDT ----- CT showed no abnormalities.  Recommend scheduling cystoscopy to fully evaluate the bladder which is not adequately evaluated on CT.

## 2019-03-20 NOTE — Telephone Encounter (Signed)
Spoke with patient gave his CT results over the phone all questions answered Cysto app has been scheduled   Sharyn Lull

## 2019-04-11 ENCOUNTER — Ambulatory Visit: Payer: BC Managed Care – PPO | Admitting: Urology

## 2019-04-11 ENCOUNTER — Encounter: Payer: Self-pay | Admitting: Urology

## 2019-04-11 ENCOUNTER — Other Ambulatory Visit: Payer: Self-pay

## 2019-04-11 VITALS — BP 146/92 | HR 84 | Ht 66.0 in | Wt 192.8 lb

## 2019-04-11 DIAGNOSIS — E291 Testicular hypofunction: Secondary | ICD-10-CM

## 2019-04-11 DIAGNOSIS — R310 Gross hematuria: Secondary | ICD-10-CM | POA: Diagnosis not present

## 2019-04-11 MED ORDER — TESTOSTERONE CYPIONATE 200 MG/ML IM SOLN: 200.0000 mg | INTRAMUSCULAR | 0 refills | Status: DC

## 2019-04-11 MED ORDER — "BD DISP NEEDLE TW 18G X 1"" MISC": 1.0000 mL | 0 refills | Status: DC

## 2019-04-11 MED ORDER — "BD LUER-LOK SYRINGE 23G X 1-1/2"" 3 ML MISC": 0 refills | Status: DC

## 2019-04-11 MED ORDER — LIDOCAINE HCL URETHRAL/MUCOSAL 2 % EX GEL: 1.0000 "application " | Freq: Once | CUTANEOUS | Status: AC

## 2019-04-11 NOTE — Progress Notes (Signed)
   04/11/19  CC:  Chief Complaint  Patient presents with  . Cysto    HPI: Isolated episode total gross painless hematuria.  Denies recurrent episodes.  NED. A&Ox3.   No respiratory distress   Abd soft, NT, ND Normal phallus with bilateral descended testicles  Imaging: CTU without upper tract abnormalities.  Right mid ureter incompletely opacified.  Images were personally reviewed  Cystoscopy Procedure Note  Patient identification was confirmed, informed consent was obtained, and patient was prepped using Betadine solution.  Lidocaine jelly was administered per urethral meatus.     Pre-Procedure: - Inspection reveals a normal caliber urethral meatus.  Procedure: The flexible cystoscope was introduced without difficulty - No urethral strictures/lesions are present. - Moderate enlargement prostate with hypervascularity - Mild elevation bladder neck - Bilateral ureteral orifices identified - Bladder mucosa  reveals no ulcers, tumors, or lesions - No bladder stones - No trabeculation  Retroflexion shows no lesions or intravesical median lobe   Post-Procedure: - Patient tolerated the procedure well  Assessment/ Plan: Isolated episode of total gross painless hematuria without significant abnormalities on CTU or cystoscopy.  Urine cytology was sent.  Routine follow-up hypogonadism scheduled January 2021.  To call earlier for recurrent gross hematuria.   Abbie Sons, MD

## 2019-04-12 LAB — URINALYSIS, COMPLETE
Bilirubin, UA: NEGATIVE
Glucose, UA: NEGATIVE
Ketones, UA: NEGATIVE
Nitrite, UA: NEGATIVE
Protein,UA: NEGATIVE
Specific Gravity, UA: 1.01 (ref 1.005–1.030)
Urobilinogen, Ur: 0.2 mg/dL (ref 0.2–1.0)
pH, UA: 6 (ref 5.0–7.5)

## 2019-04-12 LAB — MICROSCOPIC EXAMINATION: Epithelial Cells (non renal): NONE SEEN /hpf (ref 0–10)

## 2019-04-18 ENCOUNTER — Other Ambulatory Visit: Payer: Self-pay | Admitting: Urology

## 2019-04-24 ENCOUNTER — Telehealth: Payer: Self-pay

## 2019-04-24 NOTE — Telephone Encounter (Signed)
-----   Message from Abbie Sons, MD sent at 04/24/2019  3:39 PM EDT ----- Regarding: Cytology Urine cytology showed no abnormal cells. ----- Message ----- From: Delman Cheadle Sent: 04/18/2019  10:29 AM EDT To: Abbie Sons, MD

## 2019-04-24 NOTE — Telephone Encounter (Signed)
Called pt, informed him of the information below. Pt gave verbal understanding. 

## 2019-08-06 ENCOUNTER — Other Ambulatory Visit: Payer: Self-pay | Admitting: *Deleted

## 2019-08-06 DIAGNOSIS — E291 Testicular hypofunction: Secondary | ICD-10-CM

## 2019-08-07 ENCOUNTER — Other Ambulatory Visit: Payer: Self-pay

## 2019-08-07 ENCOUNTER — Other Ambulatory Visit: Payer: BC Managed Care – PPO

## 2019-08-07 DIAGNOSIS — E291 Testicular hypofunction: Secondary | ICD-10-CM

## 2019-08-08 ENCOUNTER — Telehealth: Payer: Self-pay | Admitting: *Deleted

## 2019-08-08 LAB — TESTOSTERONE: Testosterone: 1019 ng/dL — ABNORMAL HIGH (ref 264–916)

## 2019-08-08 NOTE — Telephone Encounter (Signed)
-----   Message from Riki Altes, MD sent at 08/08/2019  7:43 AM EST ----- Testosterone level was slightly elevated at 1019.  When was his last injection in relation to this blood draw?

## 2019-08-08 NOTE — Telephone Encounter (Signed)
He states it was 6 day prior to his lab that was done yesterday

## 2019-08-10 ENCOUNTER — Encounter: Payer: Self-pay | Admitting: Urology

## 2019-08-10 ENCOUNTER — Other Ambulatory Visit: Payer: Self-pay

## 2019-08-10 ENCOUNTER — Ambulatory Visit: Payer: BC Managed Care – PPO | Admitting: Urology

## 2019-08-10 VITALS — BP 146/81 | HR 84 | Ht 66.0 in | Wt 188.0 lb

## 2019-08-10 DIAGNOSIS — N400 Enlarged prostate without lower urinary tract symptoms: Secondary | ICD-10-CM

## 2019-08-10 DIAGNOSIS — E291 Testicular hypofunction: Secondary | ICD-10-CM | POA: Diagnosis not present

## 2019-08-10 NOTE — Progress Notes (Signed)
08/10/2019 3:35 PM   Jeffrey Pace [birth year] [id number]  Referring provider: Kandyce Rud, MD [phone] [address] [facility] - [facility] [place],  [place] [address]  Chief Complaint  Patient presents with  . Follow-up    6 mo F/U    Urologic history: 1.  Hypogonadism -TRT testosterone cypionate 200 mg every 2 weeks  2.  History gross hematuria 01/2019 -CTU negative for upper tract abnormalities -Cystoscopy BPH with hypervascularity -Cytology negative  3.  History nephrolithiasis -CTU 8/28 neg for calculi  HPI: 54 y.o. male presents for follow-up.  He denies recurrent gross hematuria.  No bothersome lower urinary tract symptoms.  He remains on testosterone and has good energy and libido.  Testosterone level drawn earlier this week was slightly elevated at 1019 however this was drawn just a few days after his injection.  Hematocrit is in lab as collected but there is no result.   PMH: Past Medical History:  Diagnosis Date  . Diabetes mellitus (HCC)   . Hyperlipidemia   . Hypertension   . Kidney stones   . Low testosterone in male     Surgical History: Past Surgical History:  Procedure Laterality Date  . broken foot Left 2005   repair with rods  . COLONOSCOPY WITH PROPOFOL N/A 01/05/2017   Procedure: COLONOSCOPY WITH PROPOFOL;  Surgeon: Earline Mayotte, MD;  Location: [facility] ENDOSCOPY;  Service: Endoscopy;  Laterality: N/A;    Home Medications:  Allergies as of 08/10/2019      Reactions   Demerol [meperidine] Nausea And Vomiting   Glipizide    Other reaction(s): Muscle Pain Did fine on 2.5 mg of glipizide but didn't tolerate 5 mg      Medication List       Accurate as of August 10, 2019  3:35 PM. If you have any questions, ask your nurse or doctor.        aspirin EC 81 MG tablet Take 81 mg by mouth daily.   atorvastatin 20 MG tablet Commonly known as: LIPITOR Take 20 mg by mouth daily.   B-D 3CC LUER-LOK SYR 23GX1-1/2  23G X 1-1/2" 3 ML Misc Generic drug: SYRINGE-NEEDLE (DISP) 3 ML Use to inject testosterone every 14 days   B-D DISP NEEDLE TW 18GX1" 18G X 1" Misc Generic drug: NEEDLE (DISP) 18 G 1 mL by Does not apply route every 14 (fourteen) days.   hydrochlorothiazide 25 MG tablet Commonly known as: HYDRODIURIL TAKE 1 TABLET BY MOUTH EVERY DAY   losartan 100 MG tablet Commonly known as: COZAAR TAKE 1 TABLET BY MOUTH EVERY DAY   losartan-hydrochlorothiazide 100-25 MG tablet Commonly known as: HYZAAR Take 1 tablet by mouth daily.   metFORMIN 500 MG 24 hr tablet Commonly known as: GLUCOPHAGE-XR TK 2 TS PO D WITH DINNER   polyethylene glycol powder 17 GM/SCOOP powder Commonly known as: GLYCOLAX/MIRALAX 255 grams one bottle for colonoscopy prep   sildenafil 20 MG tablet Commonly known as: REVATIO TAKE 3-5 TABLETS BY MOUTH DAILY AS NEEDED   testosterone cypionate 200 MG/ML injection Commonly known as: Depo-Testosterone Inject 1 mL (200 mg total) into the muscle every 14 (fourteen) days.   Vitamin D3 250 MCG (10000 UT) Tabs Take by mouth.       Allergies:  Allergies  Allergen Reactions  . Demerol [Meperidine] Nausea And Vomiting  . Glipizide     Other reaction(s): Muscle Pain Did fine on 2.5 mg of glipizide but didn't tolerate 5 mg  Family History: Family History  Problem Relation Age of Onset  . Lung cancer Mother 54  . Breast cancer Mother 38  . Hypertension Father   . Rectal cancer Maternal Uncle 74    Social History:  reports that he has never smoked. He has never used smokeless tobacco. He reports current alcohol use of about 4.0 standard drinks of alcohol per week. He reports that he does not use drugs.  ROS: UROLOGY Frequent Urination?: No Hard to postpone urination?: No Burning/pain with urination?: No Get up at night to urinate?: No Leakage of urine?: No Urine stream starts and stops?: No Trouble starting stream?: No Do you have to strain to urinate?:  No Blood in urine?: No Urinary tract infection?: No Sexually transmitted disease?: No Injury to kidneys or bladder?: No Painful intercourse?: No Weak stream?: No Erection problems?: No Penile pain?: No  Gastrointestinal Nausea?: No Vomiting?: No Indigestion/heartburn?: No Diarrhea?: No Constipation?: No  Constitutional Fever: No Night sweats?: No Weight loss?: No Fatigue?: No  Skin Skin rash/lesions?: No Itching?: No  Eyes Blurred vision?: No Double vision?: No  Ears/Nose/Throat Sore throat?: No Sinus problems?: No  Hematologic/Lymphatic Swollen glands?: No Easy bruising?: No  Cardiovascular Leg swelling?: No Chest pain?: No  Respiratory Cough?: No Shortness of breath?: No  Endocrine Excessive thirst?: No  Musculoskeletal Back pain?: No Joint pain?: No  Neurological Headaches?: No Dizziness?: No  Psychologic Depression?: No Anxiety?: No  Physical Exam: BP (!) 146/81 (BP Location: Left Arm, Patient Position: Sitting, Cuff Size: Large)   Pulse 84   Ht 5\' 6"  (1.676 m)   Wt 188 lb (85.3 kg)   BMI 30.34 kg/m   Constitutional:  Alert and oriented, No acute distress. HEENT: [place] AT, moist mucus membranes.  Trachea midline, no masses. Cardiovascular: No clubbing, cyanosis, or edema. Respiratory: Normal respiratory effort, no increased work of breathing. Skin: No rashes, bruises or suspicious lesions. Neurologic: Grossly intact, no focal deficits, moving all 4 extremities. Psychiatric: Normal mood and affect.   Assessment & Plan:    - Hypogonadism Stable on TRT.  If hematocrit was not run he is scheduled for blood work at [facility] next month and will see if it can be added to that blood work.  Follow-up 6 months for [place], MD  [facility] [address] [place], [place] [address] [phone]

## 2019-08-12 ENCOUNTER — Encounter: Payer: Self-pay | Admitting: Urology

## 2019-08-27 ENCOUNTER — Other Ambulatory Visit: Payer: Self-pay | Admitting: *Deleted

## 2019-08-27 DIAGNOSIS — E291 Testicular hypofunction: Secondary | ICD-10-CM

## 2019-08-29 ENCOUNTER — Telehealth: Payer: Self-pay | Admitting: *Deleted

## 2019-08-29 MED ORDER — TESTOSTERONE CYPIONATE 200 MG/ML IM SOLN: 200.0000 mg | INTRAMUSCULAR | 0 refills | Status: DC

## 2019-08-29 NOTE — Telephone Encounter (Signed)
Approved per fax on 08/29/2019 until 02/102024 for testosterone . Will send to scan in system

## 2020-01-25 ENCOUNTER — Other Ambulatory Visit: Payer: Self-pay | Admitting: Urology

## 2020-01-25 DIAGNOSIS — E291 Testicular hypofunction: Secondary | ICD-10-CM

## 2020-02-07 ENCOUNTER — Other Ambulatory Visit: Payer: Self-pay | Admitting: Family Medicine

## 2020-02-07 ENCOUNTER — Other Ambulatory Visit: Payer: Self-pay

## 2020-02-07 ENCOUNTER — Other Ambulatory Visit: Payer: BC Managed Care – PPO

## 2020-02-07 DIAGNOSIS — E291 Testicular hypofunction: Secondary | ICD-10-CM

## 2020-02-07 DIAGNOSIS — N400 Enlarged prostate without lower urinary tract symptoms: Secondary | ICD-10-CM

## 2020-02-08 LAB — TESTOSTERONE: Testosterone: 217 ng/dL — ABNORMAL LOW (ref 264–916)

## 2020-02-08 LAB — HEMATOCRIT: Hematocrit: 47.6 % (ref 37.5–51.0)

## 2020-02-08 LAB — PSA: Prostate Specific Ag, Serum: 3.4 ng/mL (ref 0.0–4.0)

## 2020-02-09 NOTE — Progress Notes (Signed)
02/11/2020 3:19 PM   Jeffrey Pace 1965-12-28 [id number]  Referring provider: Kandyce Rud, MD [phone] [address] [facility] - [facility] and Internal Medicine [place],  [place] [address] Chief Complaint  Patient presents with  . Hypogonadism   Urologic history: 1.  Hypogonadism -TRT testosterone cypionate 200 mg every 2 weeks  2.  History gross hematuria 01/2019 -CTU negative for upper tract abnormalities -Cystoscopy BPH with hypervascularity -Cytology negative  3.  History nephrolithiasis -CTU 8/28 neg for calculi  HPI: Jeffrey Pace is a 54 y.o. male with hypogonadism who is seen today for a 6 month follow-up.   -Urinary symptoms are at baseline.  -He remains on TRT.  -Patient has good energy and libido.  -Labs on 02/07/2020: PSA 3.4. Testosterone 217. Hematocrit 47.6.    PSA trend: Component     Latest Ref Rng & Units 02/08/2018 08/16/2018 02/07/2020  Prostate Specific Ag, Serum     0.0 - 4.0 ng/mL 1.7 1.7 3.4      PMH: Past Medical History:  Diagnosis Date  . Diabetes mellitus (HCC)   . Hyperlipidemia   . Hypertension   . Kidney stones   . Low testosterone in male     Surgical History: Past Surgical History:  Procedure Laterality Date  . broken foot Left 2005   repair with rods  . COLONOSCOPY WITH PROPOFOL N/A 01/05/2017   Procedure: COLONOSCOPY WITH PROPOFOL;  Surgeon: Earline Mayotte, MD;  Location: [facility] ENDOSCOPY;  Service: Endoscopy;  Laterality: N/A;    Home Medications:  Allergies as of 02/11/2020      Reactions   Demerol [meperidine] Nausea And Vomiting   Glipizide    Other reaction(s): Muscle Pain Did fine on 2.5 mg of glipizide but didn't tolerate 5 mg      Medication List       Accurate as of February 11, 2020  3:19 PM. If you have any questions, ask your nurse or doctor.        STOP taking these medications   losartan 100 MG tablet Commonly known as: COZAAR Stopped by: Riki Altes, MD     TAKE these medications    aspirin EC 81 MG tablet Take 81 mg by mouth daily.   atorvastatin 20 MG tablet Commonly known as: LIPITOR Take 20 mg by mouth daily.   B-D 3CC LUER-LOK SYR 23GX1-1/2 23G X 1-1/2" 3 ML Misc Generic drug: SYRINGE-NEEDLE (DISP) 3 ML Use to inject testosterone every 14 days   B-D DISP NEEDLE TW 18GX1" 18G X 1" Misc Generic drug: NEEDLE (DISP) 18 G 1 mL by Does not apply route every 14 (fourteen) days.   hydrochlorothiazide 25 MG tablet Commonly known as: HYDRODIURIL TAKE 1 TABLET BY MOUTH EVERY DAY   losartan-hydrochlorothiazide 100-25 MG tablet Commonly known as: HYZAAR Take 1 tablet by mouth daily.   metFORMIN 500 MG 24 hr tablet Commonly known as: GLUCOPHAGE-XR TK 2 TS PO D WITH DINNER   polyethylene glycol powder 17 GM/SCOOP powder Commonly known as: GLYCOLAX/MIRALAX 255 grams one bottle for colonoscopy prep   sildenafil 20 MG tablet Commonly known as: REVATIO TAKE 3-5 TABLETS BY MOUTH DAILY AS NEEDED   testosterone cypionate 200 MG/ML injection Commonly known as: DEPOTESTOSTERONE CYPIONATE INJECT 1 ML IN THE MUSCLE EVERY 14 DAYS   Vitamin D3 250 MCG (10000 UT) Tabs Take by mouth.       Allergies:  Allergies  Allergen Reactions  . Demerol [Meperidine] Nausea And Vomiting  . Glipizide  Other reaction(s): Muscle Pain Did fine on 2.5 mg of glipizide but didn't tolerate 5 mg    Family History: Family History  Problem Relation Age of Onset  . Lung cancer Mother 52  . Breast cancer Mother 45  . Hypertension Father   . Rectal cancer Maternal Uncle 52    Social History:  reports that he has never smoked. He has never used smokeless tobacco. He reports current alcohol use of about 4.0 standard drinks of alcohol per week. He reports that he does not use drugs.   Physical Exam: BP (!) 172/98   Pulse (!) 106   Ht 5\' 6"  (1.676 m)   Wt 190 lb (86.2 kg)   BMI 30.67 kg/m   Constitutional:  Alert and oriented, No acute distress. HEENT: [place] AT, moist mucus  membranes.  Trachea midline, no masses. Cardiovascular: No clubbing, cyanosis, or edema. Respiratory: Normal respiratory effort, no increased work of breathing. GU: Prostate 35 g, smooth without nodules Skin: No rashes, bruises or suspicious lesions. Neurologic: Grossly intact, no focal deficits, moving all 4 extremities. Psychiatric: Normal mood and affect.  Laboratory Data:   Lab Results  Component Value Date   TESTOSTERONE 217 (L) 02/07/2020     Assessment & Plan:    1. Hypogonadism  -Stable symptoms on TRT -Most recent T level was a trough level however he is asymptomatic and will continue present dose -PSA elevated above baseline at 3.4 and will recheck in 6 weeks -Refill testosterone, needles, syringes  2. Erectile dysfunction -Refill sildenafil   [facility] [address] [place], [place] [phone]  I, [phone], am acting as a scribe for Dr. Theador Hawthorne C. Handsome Anglin,  I have reviewed the above documentation for accuracy and completeness, and I agree with the above.   Lorin Picket, MD

## 2020-02-11 ENCOUNTER — Encounter: Payer: Self-pay | Admitting: Urology

## 2020-02-11 ENCOUNTER — Other Ambulatory Visit: Payer: Self-pay

## 2020-02-11 ENCOUNTER — Ambulatory Visit (INDEPENDENT_AMBULATORY_CARE_PROVIDER_SITE_OTHER): Payer: BC Managed Care – PPO | Admitting: Urology

## 2020-02-11 DIAGNOSIS — E291 Testicular hypofunction: Secondary | ICD-10-CM

## 2020-02-13 ENCOUNTER — Encounter: Payer: Self-pay | Admitting: Urology

## 2020-02-13 MED ORDER — SILDENAFIL CITRATE 20 MG PO TABS: ORAL_TABLET | ORAL | 2 refills | Status: DC

## 2020-02-13 MED ORDER — TESTOSTERONE CYPIONATE 200 MG/ML IM SOLN: INTRAMUSCULAR | 0 refills | Status: DC

## 2020-02-13 MED ORDER — "BD LUER-LOK SYRINGE 23G X 1-1/2"" 3 ML MISC": 0 refills | Status: DC

## 2020-02-13 MED ORDER — "BD DISP NEEDLE TW 18G X 1"" MISC": 1.0000 mL | 0 refills | Status: DC

## 2020-03-25 ENCOUNTER — Other Ambulatory Visit: Payer: Self-pay | Admitting: Family Medicine

## 2020-03-25 DIAGNOSIS — N400 Enlarged prostate without lower urinary tract symptoms: Secondary | ICD-10-CM

## 2020-03-26 ENCOUNTER — Other Ambulatory Visit: Payer: Self-pay

## 2020-03-26 ENCOUNTER — Other Ambulatory Visit: Payer: BC Managed Care – PPO

## 2020-03-26 DIAGNOSIS — N400 Enlarged prostate without lower urinary tract symptoms: Secondary | ICD-10-CM

## 2020-03-27 LAB — PSA: Prostate Specific Ag, Serum: 3.4 ng/mL (ref 0.0–4.0)

## 2020-03-28 ENCOUNTER — Telehealth: Payer: Self-pay | Admitting: Urology

## 2020-03-28 DIAGNOSIS — R972 Elevated prostate specific antigen [PSA]: Secondary | ICD-10-CM

## 2020-03-28 NOTE — Telephone Encounter (Signed)
Notified patient as instructed, patient pleased. Discussed follow-up appointments, patient agrees  

## 2020-03-28 NOTE — Telephone Encounter (Signed)
Repeat PSA remains elevated above baseline at 3.4.  Recommend scheduling prostate MRI.  Order was entered and will call with results

## 2020-08-04 ENCOUNTER — Other Ambulatory Visit: Payer: Self-pay | Admitting: Urology

## 2020-08-04 DIAGNOSIS — E291 Testicular hypofunction: Secondary | ICD-10-CM

## 2020-08-05 ENCOUNTER — Other Ambulatory Visit: Payer: Self-pay | Admitting: Urology

## 2020-08-05 DIAGNOSIS — E291 Testicular hypofunction: Secondary | ICD-10-CM

## 2020-08-07 ENCOUNTER — Telehealth: Payer: Self-pay | Admitting: Urology

## 2020-08-07 ENCOUNTER — Other Ambulatory Visit: Payer: Self-pay | Admitting: *Deleted

## 2020-08-07 DIAGNOSIS — E291 Testicular hypofunction: Secondary | ICD-10-CM

## 2020-08-07 MED ORDER — SILDENAFIL CITRATE 20 MG PO TABS: 60.0000 mg | ORAL_TABLET | Freq: Every day | ORAL | 3 refills | Status: DC | PRN

## 2020-08-07 MED ORDER — "BD LUER-LOK SYRINGE 23G X 1-1/2"" 3 ML MISC": 0 refills | Status: DC

## 2020-08-07 NOTE — Telephone Encounter (Signed)
It does not look like his MRI was ever ordered.  Prior to ordering would recommend lab visit this month for testosterone and repeat PSA.  We will proceed with MRI if PSA remains persistently elevated above baseline.  Testosterone was refilled. °

## 2020-08-08 NOTE — Telephone Encounter (Signed)
It does not look like his MRI was ever ordered.  Prior to ordering would recommend lab visit this month for testosterone and repeat PSA.  We will proceed with MRI if PSA remains persistently elevated above baseline.  Testosterone was refilled.

## 2020-08-20 ENCOUNTER — Other Ambulatory Visit: Payer: Self-pay | Admitting: *Deleted

## 2020-08-20 DIAGNOSIS — R972 Elevated prostate specific antigen [PSA]: Secondary | ICD-10-CM

## 2020-08-20 DIAGNOSIS — E291 Testicular hypofunction: Secondary | ICD-10-CM

## 2020-08-21 ENCOUNTER — Other Ambulatory Visit: Payer: Self-pay

## 2020-08-21 ENCOUNTER — Other Ambulatory Visit: Payer: BC Managed Care – PPO

## 2020-08-21 DIAGNOSIS — R972 Elevated prostate specific antigen [PSA]: Secondary | ICD-10-CM

## 2020-08-21 DIAGNOSIS — E291 Testicular hypofunction: Secondary | ICD-10-CM

## 2020-08-22 LAB — TESTOSTERONE: Testosterone: 801 ng/dL (ref 264–916)

## 2020-08-22 LAB — PSA: Prostate Specific Ag, Serum: 5.6 ng/mL — ABNORMAL HIGH (ref 0.0–4.0)

## 2020-08-23 ENCOUNTER — Other Ambulatory Visit: Payer: Self-pay | Admitting: Urology

## 2020-08-23 DIAGNOSIS — R972 Elevated prostate specific antigen [PSA]: Secondary | ICD-10-CM

## 2020-08-25 ENCOUNTER — Encounter: Payer: Self-pay | Admitting: *Deleted

## 2020-09-08 ENCOUNTER — Ambulatory Visit
Admission: RE | Admit: 2020-09-08 | Discharge: 2020-09-08 | Disposition: A | Discharge status: Home / Self Care | Payer: BC Managed Care – PPO | Source: Ambulatory Visit | Attending: Urology | Admitting: Urology

## 2020-09-08 ENCOUNTER — Other Ambulatory Visit: Payer: Self-pay

## 2020-09-08 DIAGNOSIS — R972 Elevated prostate specific antigen [PSA]: Secondary | ICD-10-CM | POA: Insufficient documentation

## 2020-09-08 IMAGING — MR MR PROSTATE WO/W CM
56 series · 56 of 56 positions shown · IV contrast (8ml Gadavist)
Comparison: [DATE] abdominopelvic CT

CLINICAL DATA: Elevated PSA.  On testosterone replacement.

EXAM:
MR PROSTATE WITHOUT AND WITH CONTRAST
TECHNIQUE: Multiplanar multisequence MRI images were obtained of the pelvis
centered about the prostate. Pre and post contrast images were
obtained.
CONTRAST:  8mL GADAVIST GADOBUTROL 1 MMOL/ML IV SOLN

[Series 3: ax in&out whole · axial · 5.0mm · 0.74mm/px · 1 of 70 slices shown]
[im 1/70]
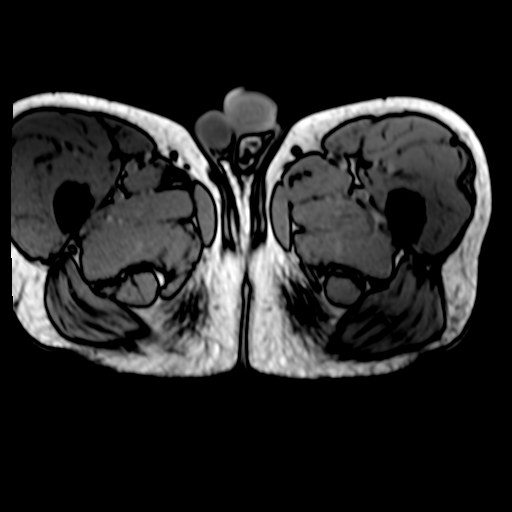

[Series 4: T2 · axial · 3.0mm · 0.56mm/px · 1 of 25 slices shown (1 of 3)]
[im 1/25]
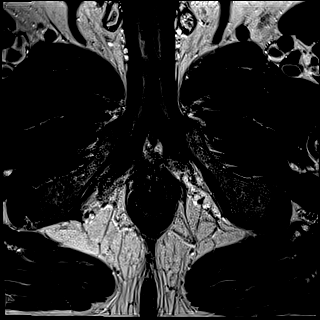

[Series 5: T2 · coronal · 3.0mm · 0.70mm/px · 1 of 35 slices shown (2 of 3)]
[im 1/35]
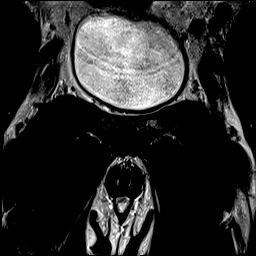

[Series 6: DWI · axial · 3.0mm · 0.86mm/px · 1 of 75 slices shown (1 of 3)]
[im 1/75]
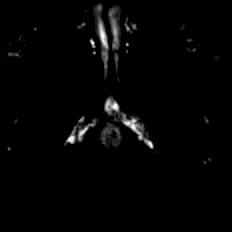

[Series 7: DWI · axial · 3.0mm · 0.86mm/px · 1 of 25 slices shown (2 of 3)]
[im 1/25]
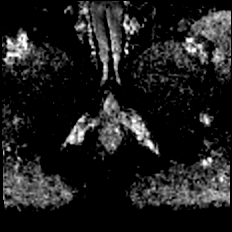

[Series 8: DWI · axial · 3.0mm · 0.86mm/px · 1 of 25 slices shown (3 of 3)]
[im 1/25]
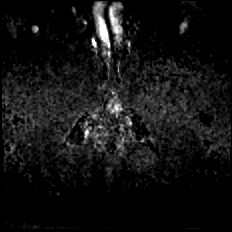

[Series 9: T2 · axial · 1.0mm · 1.04mm/px · 1 of 72 slices shown (3 of 3)]
[im 1/72]
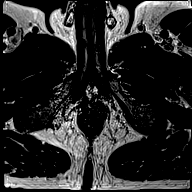

[Series 10: T1 · axial · 3.0mm · 1.15mm/px · 1 of 28 slices shown (1 of 49)]
[im 1/28]
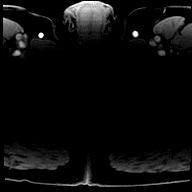

[Series 11: T1 · axial · 3.0mm · 1.15mm/px · 1 of 28 slices shown (2 of 49)]
[im 1/28]
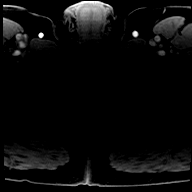

[Series 12: T1 · axial · 3.0mm · 1.15mm/px · 1 of 28 slices shown (3 of 49)]
[im 1/28]
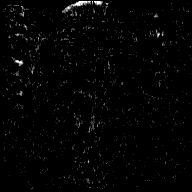

[Series 13: T1 · axial · 3.0mm · 1.15mm/px · 1 of 28 slices shown (4 of 49)]
[im 1/28]
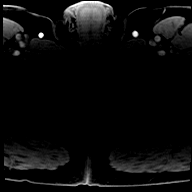

[Series 14: T1 · axial · 3.0mm · 1.15mm/px · 1 of 28 slices shown (5 of 49)]
[im 1/28]
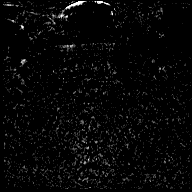

[Series 15: T1 · axial · 3.0mm · 1.15mm/px · 1 of 28 slices shown (6 of 49)]
[im 1/28]
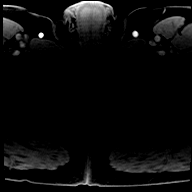

[Series 16: T1 · axial · 3.0mm · 1.15mm/px · 1 of 28 slices shown (7 of 49)]
[im 1/28]
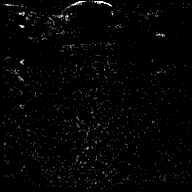

[Series 17: T1 · axial · 3.0mm · 1.15mm/px · 1 of 28 slices shown (8 of 49)]
[im 1/28]
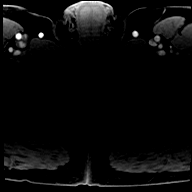

[Series 18: T1 · axial · 3.0mm · 1.15mm/px · 1 of 28 slices shown (9 of 49)]
[im 1/28]
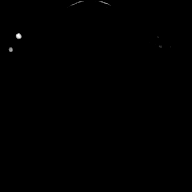

[Series 19: T1 · axial · 3.0mm · 1.15mm/px · 1 of 28 slices shown (10 of 49)]
[im 1/28]
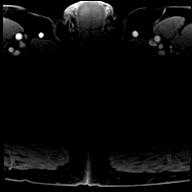

[Series 20: T1 · axial · 3.0mm · 1.15mm/px · 1 of 28 slices shown (11 of 49)]
[im 1/28]
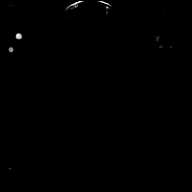

[Series 21: T1 · axial · 3.0mm · 1.15mm/px · 1 of 28 slices shown (12 of 49)]
[im 1/28]
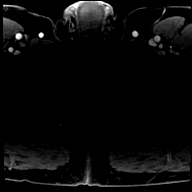

[Series 22: T1 · axial · 3.0mm · 1.15mm/px · 1 of 28 slices shown (13 of 49)]
[im 1/28]
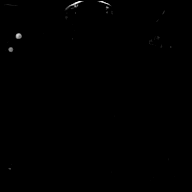

[Series 23: T1 · axial · 3.0mm · 1.15mm/px · 1 of 28 slices shown (14 of 49)]
[im 1/28]
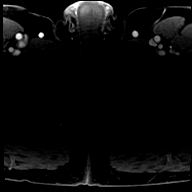

[Series 24: T1 · axial · 3.0mm · 1.15mm/px · 1 of 28 slices shown (15 of 49)]
[im 1/28]
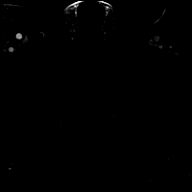

[Series 25: T1 · axial · 3.0mm · 1.15mm/px · 1 of 28 slices shown (16 of 49)]
[im 1/28]
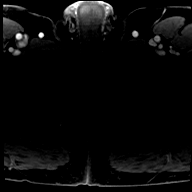

[Series 26: T1 · axial · 3.0mm · 1.15mm/px · 1 of 28 slices shown (17 of 49)]
[im 1/28]
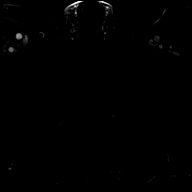

[Series 27: T1 · axial · 3.0mm · 1.15mm/px · 1 of 28 slices shown (18 of 49)]
[im 1/28]
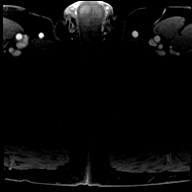

[Series 28: T1 · axial · 3.0mm · 1.15mm/px · 1 of 28 slices shown (19 of 49)]
[im 1/28]
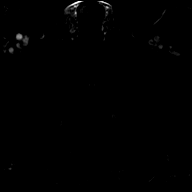

[Series 29: T1 · axial · 3.0mm · 1.15mm/px · 1 of 28 slices shown (20 of 49)]
[im 1/28]
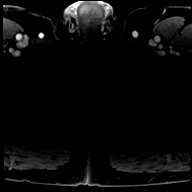

[Series 30: T1 · axial · 3.0mm · 1.15mm/px · 1 of 28 slices shown (21 of 49)]
[im 1/28]
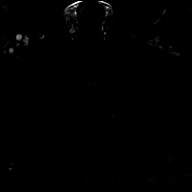

[Series 31: T1 · axial · 3.0mm · 1.15mm/px · 1 of 28 slices shown (22 of 49)]
[im 1/28]
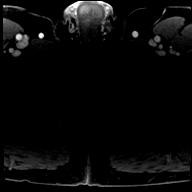

[Series 32: T1 · axial · 3.0mm · 1.15mm/px · 1 of 28 slices shown (23 of 49)]
[im 1/28]
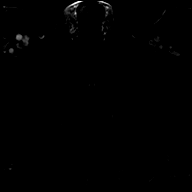

[Series 33: T1 · axial · 3.0mm · 1.15mm/px · 1 of 28 slices shown (24 of 49)]
[im 1/28]
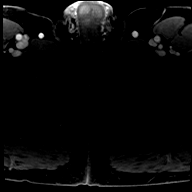

[Series 34: T1 · axial · 3.0mm · 1.15mm/px · 1 of 28 slices shown (25 of 49)]
[im 1/28]
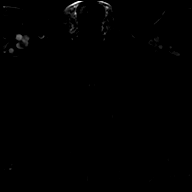

[Series 35: T1 · axial · 3.0mm · 1.15mm/px · 1 of 28 slices shown (26 of 49)]
[im 1/28]
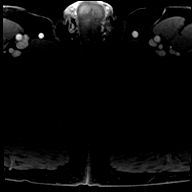

[Series 36: T1 · axial · 3.0mm · 1.15mm/px · 1 of 28 slices shown (27 of 49)]
[im 1/28]
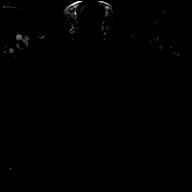

[Series 37: T1 · axial · 3.0mm · 1.15mm/px · 1 of 28 slices shown (28 of 49)]
[im 1/28]
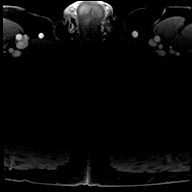

[Series 38: T1 · axial · 3.0mm · 1.15mm/px · 1 of 28 slices shown (29 of 49)]
[im 1/28]
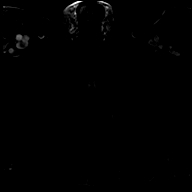

[Series 39: T1 · axial · 3.0mm · 1.15mm/px · 1 of 28 slices shown (30 of 49)]
[im 1/28]
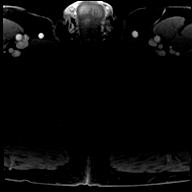

[Series 40: T1 · axial · 3.0mm · 1.15mm/px · 1 of 28 slices shown (31 of 49)]
[im 1/28]
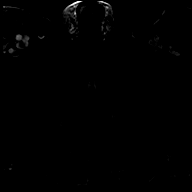

[Series 41: T1 · axial · 3.0mm · 1.15mm/px · 1 of 28 slices shown (32 of 49)]
[im 1/28]
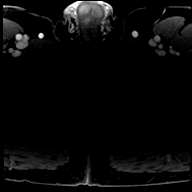

[Series 42: T1 · axial · 3.0mm · 1.15mm/px · 1 of 28 slices shown (33 of 49)]
[im 1/28]
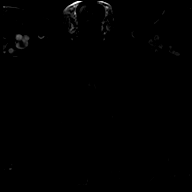

[Series 43: T1 · axial · 3.0mm · 1.15mm/px · 1 of 28 slices shown (34 of 49)]
[im 1/28]
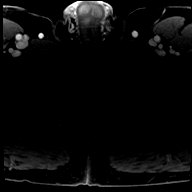

[Series 44: T1 · axial · 3.0mm · 1.15mm/px · 1 of 28 slices shown (35 of 49)]
[im 1/28]
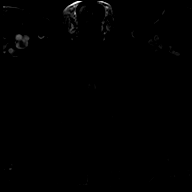

[Series 45: T1 · axial · 3.0mm · 1.15mm/px · 1 of 28 slices shown (36 of 49)]
[im 1/28]
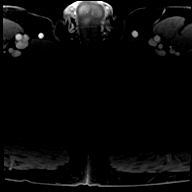

[Series 46: T1 · axial · 3.0mm · 1.15mm/px · 1 of 28 slices shown (37 of 49)]
[im 1/28]
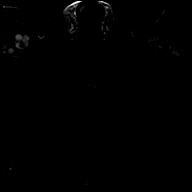

[Series 47: T1 · axial · 3.0mm · 1.15mm/px · 1 of 28 slices shown (38 of 49)]
[im 1/28]
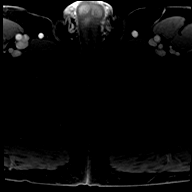

[Series 48: T1 · axial · 3.0mm · 1.15mm/px · 1 of 28 slices shown (39 of 49)]
[im 1/28]
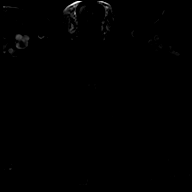

[Series 49: T1 · axial · 3.0mm · 1.15mm/px · 1 of 28 slices shown (40 of 49)]
[im 1/28]
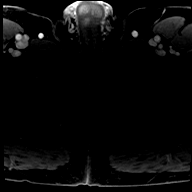

[Series 50: T1 · axial · 3.0mm · 1.15mm/px · 1 of 28 slices shown (41 of 49)]
[im 1/28]
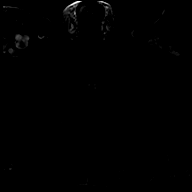

[Series 51: T1 · axial · 3.0mm · 1.15mm/px · 1 of 28 slices shown (42 of 49)]
[im 1/28]
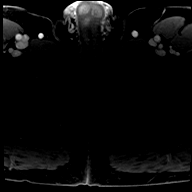

[Series 52: T1 · axial · 3.0mm · 1.15mm/px · 1 of 28 slices shown (43 of 49)]
[im 1/28]
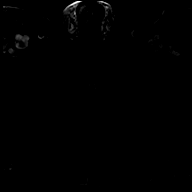

[Series 53: T1 · axial · 3.0mm · 1.15mm/px · 1 of 28 slices shown (44 of 49)]
[im 1/28]
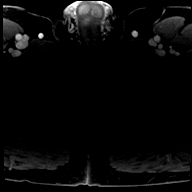

[Series 54: T1 · axial · 3.0mm · 1.15mm/px · 1 of 28 slices shown (45 of 49)]
[im 1/28]
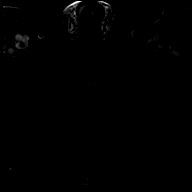

[Series 55: T1 · axial · 3.0mm · 1.15mm/px · 1 of 28 slices shown (46 of 49)]
[im 1/28]
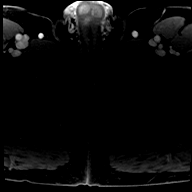

[Series 56: T1 · axial · 3.0mm · 1.15mm/px · 1 of 28 slices shown (47 of 49)]
[im 1/28]
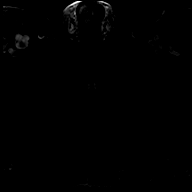

[Series 57: T1 · axial · 3.0mm · 1.15mm/px · 1 of 28 slices shown (48 of 49)]
[im 1/28]
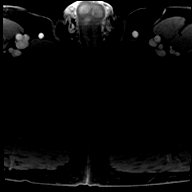

[Series 58: T1 · axial · 3.0mm · 1.15mm/px · 1 of 28 slices shown (49 of 49)]
[im 1/28]
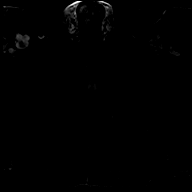

[56 of 56 positions shown; findings below may reference images not displayed]

FINDINGS: Prostate: Demonstrates no suspicious central gland nodule.
Heterogeneous T2 signal throughout the peripheral zone, without
areas of masslike T2 hypointensity, restricted diffusion.

Mild, non masslike peripheral zone early post-contrast enhancement
within the mid gland is nonspecific.

Volume: 4.7 x 3.9 x 3.2 cm (volume = 31 cm^3)

Transcapsular spread:  Absent

Seminal vesicle involvement: Absent

Neurovascular bundle involvement: Absent

Pelvic adenopathy: Absent

Bone metastasis: Absent

Other findings: No significant free fluid. Normal urinary bladder.
Sigmoid diverticulosis.
IMPRESSION: No findings of high-grade or macroscopic prostate carcinoma.

## 2020-09-08 MED ORDER — GADOBUTROL 1 MMOL/ML IV SOLN
8.0000 mL | Freq: Once | INTRAVENOUS | Status: AC | PRN
  Administered 2020-09-08: 8 mL via INTRAVENOUS

## 2020-09-09 ENCOUNTER — Telehealth: Payer: Self-pay | Admitting: Urology

## 2020-09-09 ENCOUNTER — Encounter: Payer: Self-pay | Admitting: *Deleted

## 2020-09-09 NOTE — Telephone Encounter (Signed)
Notified patient as instructed, patient pleased. Went over instruction with patient.  Patient states he put his self on asprin 81. He will stop 7 day before biopsy

## 2020-09-09 NOTE — Telephone Encounter (Signed)
MRI did not show any areas suspicious for high-grade prostate cancer.  Since his PSA has continued to rise and most recent value was abnormal at 5.6 would recommend scheduling a standard office prostate biopsy for further evaluation.

## 2020-10-02 ENCOUNTER — Encounter: Payer: Self-pay | Admitting: Urology

## 2020-10-02 ENCOUNTER — Ambulatory Visit: Payer: BC Managed Care – PPO | Admitting: Urology

## 2020-10-02 ENCOUNTER — Other Ambulatory Visit: Payer: Self-pay

## 2020-10-02 VITALS — BP 148/92 | HR 109 | Ht 66.0 in | Wt 190.0 lb

## 2020-10-02 DIAGNOSIS — R972 Elevated prostate specific antigen [PSA]: Secondary | ICD-10-CM | POA: Diagnosis not present

## 2020-10-02 MED ORDER — CEFTRIAXONE SODIUM 1 G IJ SOLR
1.0000 g | Freq: Once | INTRAMUSCULAR | Status: AC
  Administered 2020-10-02: 1 g via INTRAMUSCULAR

## 2020-10-02 NOTE — Progress Notes (Signed)
Prostate Biopsy Procedure   Informed consent was obtained after discussing risks/benefits of the procedure.  A time out was performed to ensure correct patient identity.  Pre-Procedure: - Last PSA Level: 5.6 08/2020; on TRT with PSA rise 1.7-3.4-5.6 -Prostate MRI with 31 g gland and no suspicious lesions -Ceftriaxone given prophylactically -Transrectal Ultrasound performed revealing a 30 gm prostate -No significant hypoechoic or median lobe noted  Procedure: - Prostate block performed using 10 cc 1% lidocaine and biopsies taken from sextant areas, a total of 12 under ultrasound guidance.  Post-Procedure: - Patient tolerated the procedure well - He was counseled to seek immediate medical attention if experiences any severe pain, significant bleeding, or fevers -Will call with biopsy results    Irineo Axon, MD

## 2020-10-07 LAB — SURGICAL PATHOLOGY

## 2020-10-12 ENCOUNTER — Encounter: Payer: Self-pay | Admitting: Urology

## 2021-02-03 ENCOUNTER — Other Ambulatory Visit: Payer: Self-pay | Admitting: *Deleted

## 2021-02-03 DIAGNOSIS — R972 Elevated prostate specific antigen [PSA]: Secondary | ICD-10-CM

## 2021-04-15 ENCOUNTER — Other Ambulatory Visit: Payer: BC Managed Care – PPO

## 2021-04-15 ENCOUNTER — Other Ambulatory Visit: Payer: Self-pay

## 2021-04-15 DIAGNOSIS — R972 Elevated prostate specific antigen [PSA]: Secondary | ICD-10-CM

## 2021-04-16 LAB — PSA: Prostate Specific Ag, Serum: 0.8 ng/mL (ref 0.0–4.0)

## 2021-04-17 ENCOUNTER — Other Ambulatory Visit: Payer: Self-pay

## 2021-04-17 ENCOUNTER — Telehealth: Payer: Self-pay

## 2021-04-17 ENCOUNTER — Encounter: Payer: Self-pay | Admitting: Urology

## 2021-04-17 ENCOUNTER — Ambulatory Visit (INDEPENDENT_AMBULATORY_CARE_PROVIDER_SITE_OTHER): Payer: BC Managed Care – PPO | Admitting: Urology

## 2021-04-17 VITALS — BP 135/88 | HR 92 | Ht 66.0 in | Wt 201.0 lb

## 2021-04-17 DIAGNOSIS — R972 Elevated prostate specific antigen [PSA]: Secondary | ICD-10-CM | POA: Diagnosis not present

## 2021-04-17 DIAGNOSIS — E291 Testicular hypofunction: Secondary | ICD-10-CM

## 2021-04-17 NOTE — Telephone Encounter (Signed)
Called [facility], requested Testosterone (test [phone]) be added on to PSA drawn 04/15/2021. Test added, they will fax authorization to be signed.

## 2021-04-17 NOTE — Patient Instructions (Signed)
Prostate Cancer Screening Prostate cancer screening is a test that is done to check for the presence of prostate cancer in men. The prostate gland is a walnut-sized gland that is located below the bladder and in front of the rectum in males. The function of the prostate is to add fluid to semen during ejaculation. Prostate cancer is the second most common type of cancer in men. Who should have prostate cancer screening? Screening recommendations vary based on age and other risk factors. Screening is recommended if: You are older than age 54. If you are age 55-69, talk with your health care provider about your need for screening and how often screening should be done. Because most prostate cancers are slow growing and will not cause death, screening is generally reserved in this age group for men who have a 10-15-year life expectancy. You are younger than age 54, and you have these risk factors: Being a Black male or a male of African descent. Having a father, brother, or uncle who has been diagnosed with prostate cancer. The risk is higher if your family member's cancer occurred at an early age. Screening is not recommended if: You are younger than age 40. You are between the ages of 40 and 54 and you have no risk factors. You are 70 years of age or older. At this age, the risks that screening can cause are greater than the benefits that it may provide. If you are at high risk for prostate cancer, your health care provider may recommend that you have screenings more often or that you start screening at a younger age. How is screening for prostate cancer done? The recommended prostate cancer screening test is a blood test called the prostate-specific antigen (PSA) test. PSA is a protein that is made in the prostate. As you age, your prostate naturally produces more PSA. Abnormally high PSA levels may be caused by: Prostate cancer. An enlarged prostate that is not caused by cancer (benign prostatic  hyperplasia, BPH). This condition is very common in older men. A prostate gland infection (prostatitis). Depending on the PSA results, you may need more tests, such as: A physical exam to check the size of your prostate gland. Blood and imaging tests. A procedure to remove tissue samples from your prostate gland for testing (biopsy). What are the benefits of prostate cancer screening? Screening can help to identify cancer at an early stage, before symptoms start and when the cancer can be treated more easily. There is a small chance that screening may lower your risk of dying from prostate cancer. The chance is small because prostate cancer is a slow-growing cancer, and most men with prostate cancer die from a different cause. What are the risks of prostate cancer screening? The main risk of prostate cancer screening is diagnosing and treating prostate cancer that would never have caused any symptoms or problems. This is called overdiagnosisand overtreatment. PSA screening cannot tell you if your PSA is high due to cancer or a different cause. A prostate biopsy is the only procedure to diagnose prostate cancer. Even the results of a biopsy may not tell you if your cancer needs to be treated. Slow-growing prostate cancer may not need any treatment other than monitoring, so diagnosing and treating it may cause unnecessary stress or other side effects. Questions to ask your health care provider When should I start prostate cancer screening? What is my risk for prostate cancer? How often do I need screening? What type of screening tests do   I need? How do I get my test results? What do my results mean? Do I need treatment? Where to find more information The American Cancer Society: www.cancer.org American Urological Association: www.auanet.org Contact a health care provider if: You have difficulty urinating. You have pain when you urinate or ejaculate. You have blood in your urine or semen. You  have pain in your back or in the area of your prostate. Summary Prostate cancer is a common type of cancer in men. The prostate gland is located below the bladder and in front of the rectum. This gland adds fluid to semen during ejaculation. Prostate cancer screening may identify cancer at an early stage, when the cancer can be treated more easily. The prostate-specific antigen (PSA) test is the recommended screening test for prostate cancer. Discuss the risks and benefits of prostate cancer screening with your health care provider. If you are age 70 or older, the risks that screening can cause are greater than the benefits that it may provide. This information is not intended to replace advice given to you by your health care provider. Make sure you discuss any questions you have with your health care provider. Document Revised: 09/05/2020 Document Reviewed: 02/15/2019 Elsevier Patient Education  2022 Elsevier Inc.  

## 2021-04-17 NOTE — Progress Notes (Signed)
04/17/2021 12:32 PM   Terressa Koyanagi 54-Dec-1967 [id number]  Referring provider: Kandyce Rud, MD [phone] [address] [facility] [place],  [place] [address]  Chief Complaint  Patient presents with   Elevated PSA    Urologic history: 1.  Hypogonadism -TRT testosterone cypionate 200 mg every 2 weeks   2.  History gross hematuria 01/2019 -CTU negative for upper tract abnormalities -Cystoscopy BPH with hypervascularity -Cytology negative   3.  History nephrolithiasis -CTU 8/28 neg for calculi  4.  Elevated PSA -Biopsy 09/2020 PSA 5.6  -MRI 31 g gland without suspicious lesions; TRUS 30 g gland -Path all cores benign   HPI: 55 y.o. male presents for semiannual follow-up  No significant problems since last visit He did not resume his testosterone after the biopsy and does note some tiredness, fatigue No bothersome LUTS PSA 04/15/2021 0.8   PMH: Past Medical History:  Diagnosis Date   Diabetes mellitus (HCC)    Hyperlipidemia    Hypertension    Kidney stones    Low testosterone in male     Surgical History: Past Surgical History:  Procedure Laterality Date   broken foot Left 2005   repair with rods   COLONOSCOPY WITH PROPOFOL N/A 01/05/2017   Procedure: COLONOSCOPY WITH PROPOFOL;  Surgeon: Earline Mayotte, MD;  Location: [facility] ENDOSCOPY;  Service: Endoscopy;  Laterality: N/A;    Home Medications:  Allergies as of 04/17/2021       Reactions   Demerol [meperidine] Nausea And Vomiting   Glipizide    Other reaction(s): Muscle Pain Did fine on 2.5 mg of glipizide but didn't tolerate 5 mg        Medication List        Accurate as of April 17, 2021 12:32 PM. If you have any questions, ask your nurse or doctor.          aspirin EC 81 MG tablet Take 81 mg by mouth daily.   atorvastatin 20 MG tablet Commonly known as: LIPITOR Take 20 mg by mouth daily.   B-D 3CC LUER-LOK SYR 23GX1-1/2 23G X 1-1/2" 3 ML  Misc Generic drug: SYRINGE-NEEDLE (DISP) 3 ML Use to inject testosterone every 14 days   BD Hypodermic Needle 18G X 1" Misc Generic drug: NEEDLE (DISP) 18 G USE EVERY 14 DAYS AS DIRECTED   glimepiride 2 MG tablet Commonly known as: AMARYL Take by mouth.   hydrochlorothiazide 25 MG tablet Commonly known as: HYDRODIURIL TAKE 1 TABLET BY MOUTH EVERY DAY   metFORMIN 500 MG 24 hr tablet Commonly known as: GLUCOPHAGE-XR TK 2 TS PO D WITH DINNER   olmesartan 40 MG tablet Commonly known as: BENICAR Take by mouth.   sildenafil 20 MG tablet Commonly known as: REVATIO Take 3-5 tablets (60-100 mg total) by mouth daily as needed.   sitaGLIPtin 100 MG tablet Commonly known as: JANUVIA TAKE 1 TABLET(100 MG) BY MOUTH EVERY DAY   testosterone cypionate 200 MG/ML injection Commonly known as: DEPOTESTOSTERONE CYPIONATE INJECT 1 ML IN THE MUSCLE EVERY 14 DAYS   Vitamin D3 250 MCG (10000 UT) Tabs Take by mouth.        Allergies:  Allergies  Allergen Reactions   Demerol [Meperidine] Nausea And Vomiting   Glipizide     Other reaction(s): Muscle Pain Did fine on 2.5 mg of glipizide but didn't tolerate 5 mg    Family History: Family History  Problem Relation Age of Onset   Lung cancer Mother  70   Breast cancer Mother 91   Hypertension Father    Rectal cancer Maternal Uncle 64    Social History:  reports that he has never smoked. He has never used smokeless tobacco. He reports current alcohol use of about 4.0 standard drinks per week. He reports that he does not use drugs.   Physical Exam: BP 135/88   Pulse 92   Ht 5\' 6"  (1.676 m)   Wt 201 lb (91.2 kg)   BMI 32.44 kg/m   Constitutional:  Alert and oriented, No acute distress. HEENT: [place] AT, moist mucus membranes.  Trachea midline, no masses. Cardiovascular: No clubbing, cyanosis, or edema. Respiratory: Normal respiratory effort, no increased work of breathing.   Assessment & Plan:    1.  History elevated PSA Most  recent PSA was 0.8 which is 50% below his baseline PSA of 1.7 Unlikely discontinuing the testosterone would lower his PSA this much Follow-up 6 months with repeat PSA  2.  Hypogonadism Symptomatic since stopping TRT Recheck testosterone off replacement; if low consider restarting depending on his preference   , MD  [facility] [address] [place], [place] [phone]

## 2021-04-19 ENCOUNTER — Encounter: Payer: Self-pay | Admitting: Urology

## 2021-04-20 ENCOUNTER — Other Ambulatory Visit: Payer: Self-pay | Admitting: Surgery

## 2021-04-20 DIAGNOSIS — S46012D Strain of muscle(s) and tendon(s) of the rotator cuff of left shoulder, subsequent encounter: Secondary | ICD-10-CM

## 2021-04-20 DIAGNOSIS — M7582 Other shoulder lesions, left shoulder: Secondary | ICD-10-CM

## 2021-04-21 LAB — SPECIMEN STATUS REPORT

## 2021-04-21 LAB — TESTOSTERONE: Testosterone: 70 ng/dL — ABNORMAL LOW (ref 264–916)

## 2021-04-30 ENCOUNTER — Other Ambulatory Visit: Payer: Self-pay

## 2021-04-30 ENCOUNTER — Ambulatory Visit
Admission: RE | Admit: 2021-04-30 | Discharge: 2021-04-30 | Disposition: A | Discharge status: Home / Self Care | Payer: BC Managed Care – PPO | Source: Ambulatory Visit | Attending: Surgery | Admitting: Surgery

## 2021-04-30 DIAGNOSIS — M7582 Other shoulder lesions, left shoulder: Secondary | ICD-10-CM | POA: Diagnosis present

## 2021-04-30 DIAGNOSIS — S46012D Strain of muscle(s) and tendon(s) of the rotator cuff of left shoulder, subsequent encounter: Secondary | ICD-10-CM

## 2021-04-30 IMAGING — MR MR SHOULDER*L* W/O CM
4 of 5 series · 30 of 40 positions shown · non-contrast
Comparison: None.

CLINICAL DATA: Left shoulder pain with limited range of motion
after fall 1 year ago

EXAM:
MRI OF THE LEFT SHOULDER WITHOUT CONTRAST
TECHNIQUE: Multiplanar, multisequence MR imaging of the shoulder was performed.
No intravenous contrast was administered.

[Series 5: T2 fat-sat · axial · left · 4.0mm · 0.44mm/px · z∈[-108,+2]mm · 8 of 26 slices shown (1 of 3)]
[im 1/26]
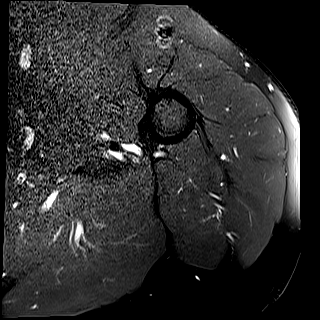
[im 4/26]
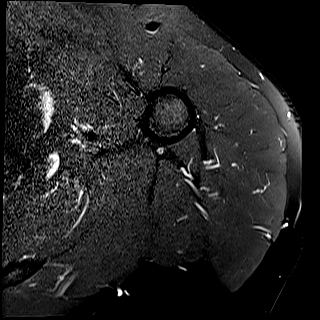
[im 8/26]
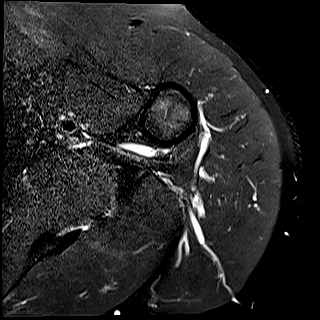
[im 11/26]
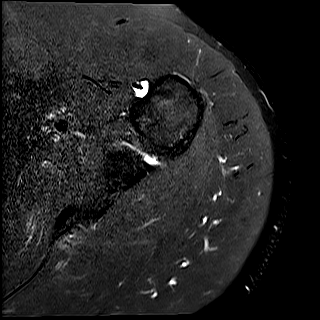
[im 15/26]
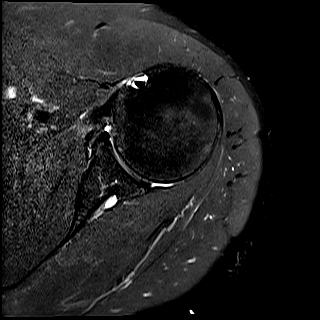
[im 18/26]
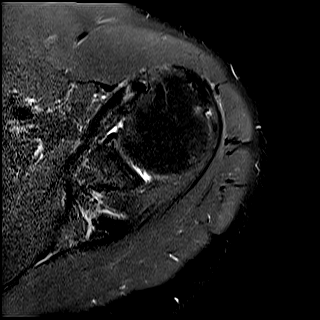
[im 22/26]
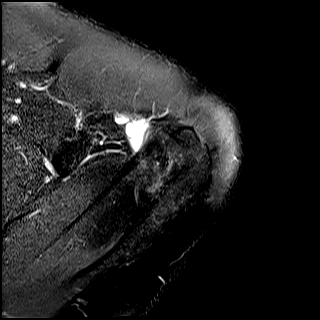
[im 26/26]
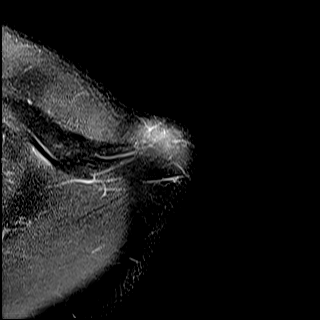

[Series 6: PD · oblique · left · 4.0mm · 0.44mm/px · 8 of 26 slices shown]
[im 1/26]
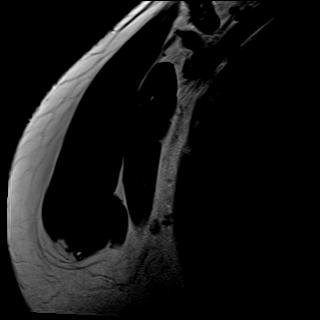
[im 4/26]
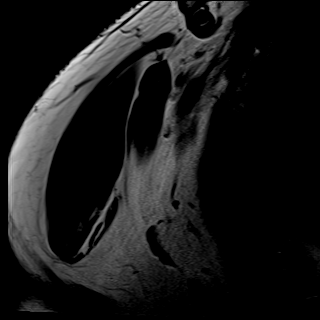
[im 8/26]
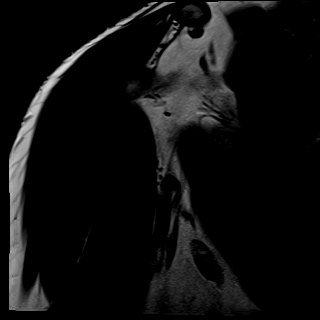
[im 11/26]
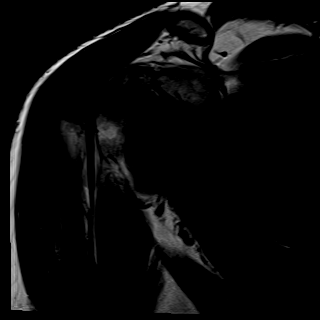
[im 15/26]
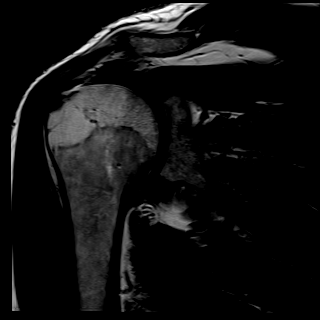
[im 18/26]
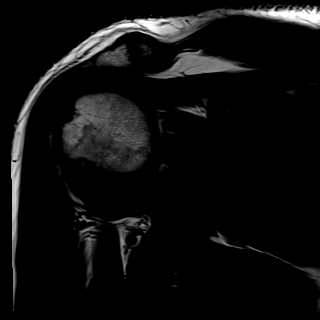
[im 22/26]
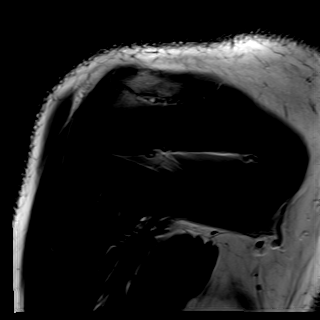
[im 26/26]
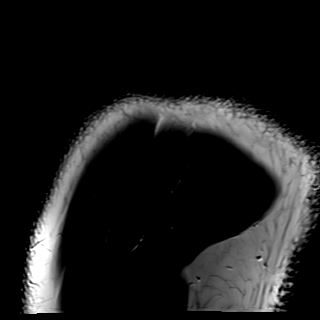

[Series 7: T2 fat-sat · oblique · left · 4.0mm · 0.44mm/px · 8 of 26 slices shown (2 of 3)]
[im 1/26]
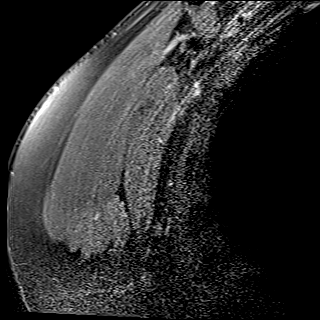
[im 4/26]
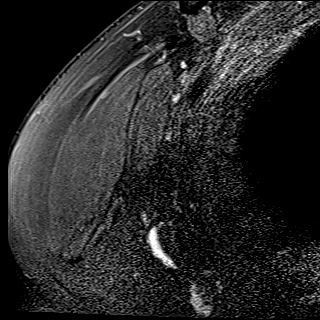
[im 8/26]
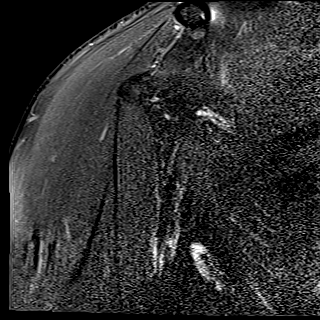
[im 11/26]
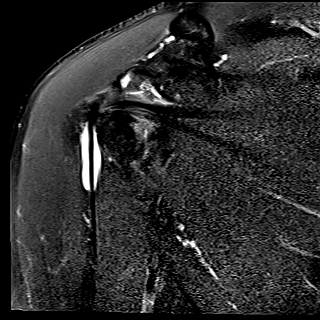
[im 15/26]
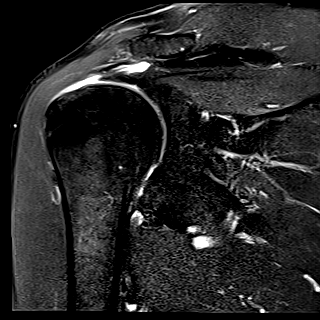
[im 18/26]
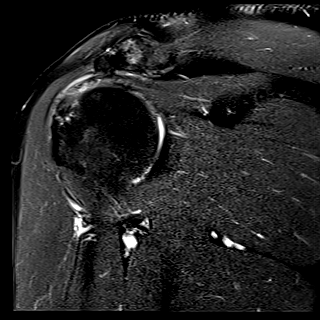
[im 22/26]
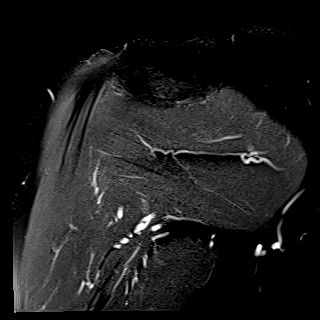
[im 26/26]
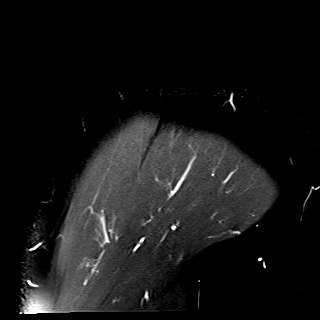

[Series 8: T2 fat-sat · oblique · left · 4.0mm · 0.22mm/px · 6 of 26 slices shown (3 of 3)]
[im 1/26]
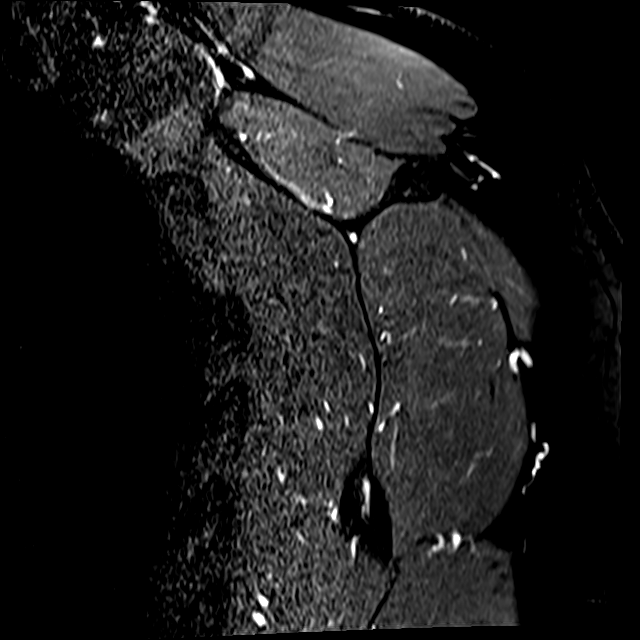
[im 4/26]
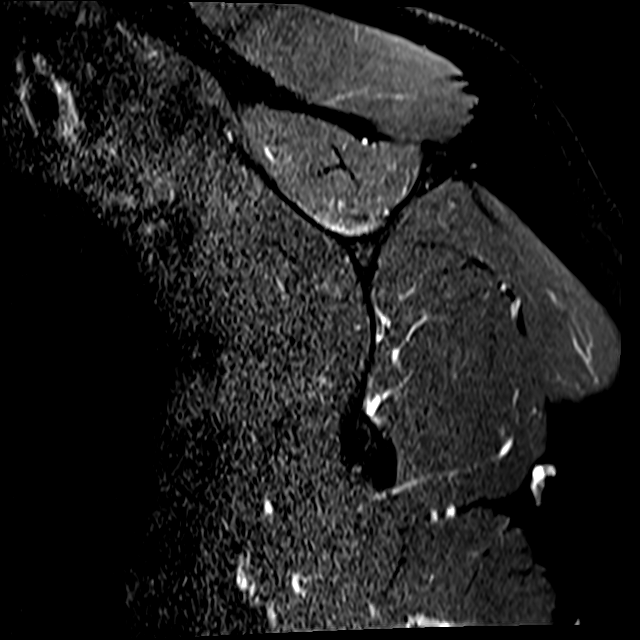
[im 8/26]
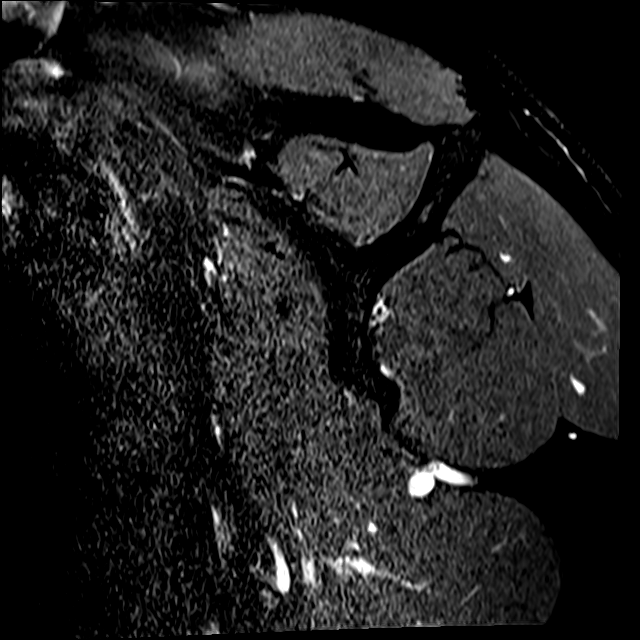
[im 11/26]
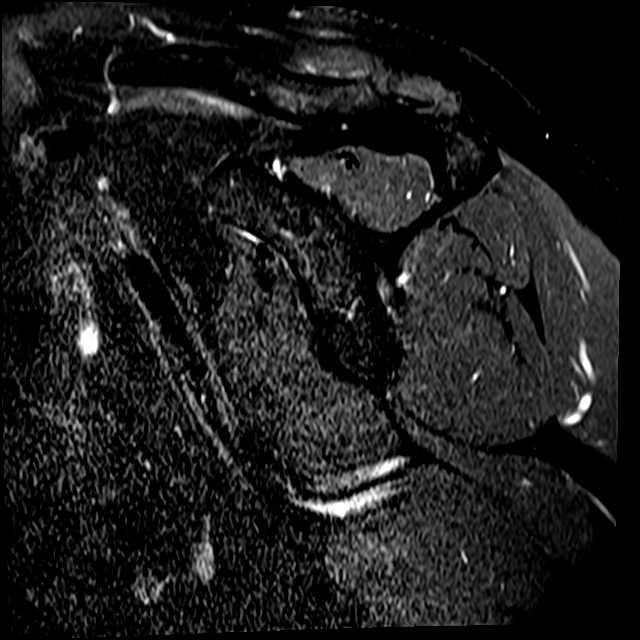
[im 15/26]
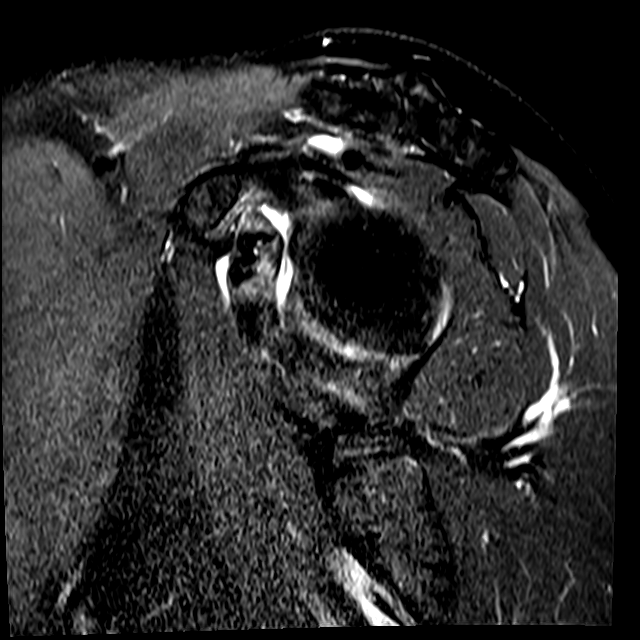
[im 22/26]
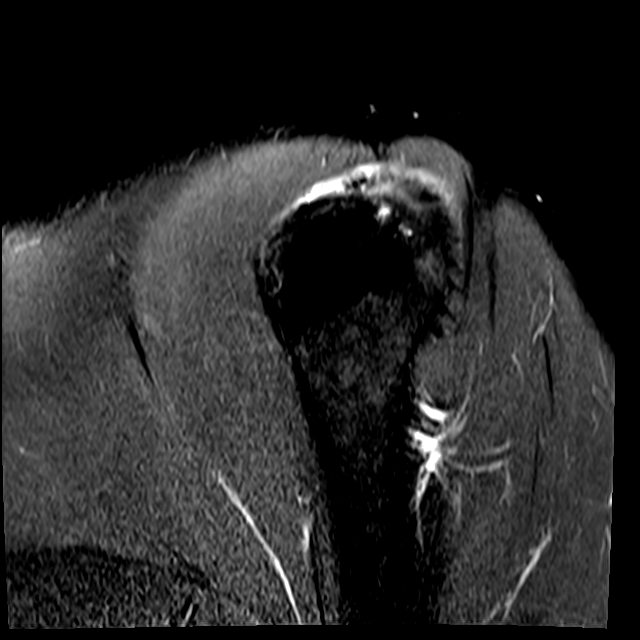

[30 of 40 positions shown; findings below may reference images not displayed]

FINDINGS: Rotator cuff: Full-thickness incomplete tear of the anterior
supraspinatus tendon insertion with retraction to the level of the
humeral head apex. Tear measures 10 mm in AP dimension.
Mild-moderate infraspinatus and subscapularis tendinosis without
focal tear. Intact teres minor.

Muscles: Preserved bulk and signal intensity of the rotator cuff
musculature without edema, atrophy, or fatty infiltration.

Biceps long head:  Intra-articular biceps tendinosis.

Acromioclavicular Joint: Mild arthropathy of the AC joint. Small
volume subacromial-subdeltoid bursal fluid communicating with the
glenohumeral joint.

Glenohumeral Joint: Mild diffuse chondral thinning. No significant
glenohumeral joint effusion.

Labrum: Grossly intact, but evaluation is limited by lack of
intraarticular fluid.

Bones: No acute fracture. No dislocation. No bone marrow edema. No
suspicious bone lesion.

Other: None.
IMPRESSION: 1. Full-thickness incomplete tear of the anterior supraspinatus
tendon insertion with retraction to the level of the humeral head
apex.
2. Mild-moderate infraspinatus and subscapularis tendinosis without
focal tear.
3. Intra-articular biceps tendinosis.
4. Mild AC and glenohumeral osteoarthritis.

## 2021-05-21 ENCOUNTER — Encounter: Payer: Self-pay | Admitting: Urology

## 2021-05-27 ENCOUNTER — Other Ambulatory Visit: Payer: Self-pay | Admitting: Urology

## 2021-05-27 DIAGNOSIS — E291 Testicular hypofunction: Secondary | ICD-10-CM

## 2021-05-27 MED ORDER — TESTOSTERONE CYPIONATE 200 MG/ML IM SOLN: 100.0000 mg | INTRAMUSCULAR | 0 refills | Status: DC

## 2021-07-21 ENCOUNTER — Other Ambulatory Visit: Payer: Self-pay | Admitting: Urology

## 2021-07-21 DIAGNOSIS — E291 Testicular hypofunction: Secondary | ICD-10-CM

## 2021-10-08 ENCOUNTER — Other Ambulatory Visit: Payer: Self-pay | Admitting: Urology

## 2021-10-08 ENCOUNTER — Other Ambulatory Visit: Payer: Self-pay

## 2021-10-08 ENCOUNTER — Other Ambulatory Visit: Payer: BC Managed Care – PPO

## 2021-10-08 DIAGNOSIS — R972 Elevated prostate specific antigen [PSA]: Secondary | ICD-10-CM

## 2021-10-08 DIAGNOSIS — E291 Testicular hypofunction: Secondary | ICD-10-CM

## 2021-10-09 ENCOUNTER — Other Ambulatory Visit: Payer: BC Managed Care – PPO

## 2021-10-09 LAB — PSA: Prostate Specific Ag, Serum: 1.6 ng/mL (ref 0.0–4.0)

## 2021-10-14 ENCOUNTER — Ambulatory Visit: Payer: BC Managed Care – PPO | Admitting: Urology

## 2021-10-14 ENCOUNTER — Encounter: Payer: Self-pay | Admitting: Urology

## 2021-10-22 ENCOUNTER — Other Ambulatory Visit: Payer: Self-pay | Admitting: *Deleted

## 2021-10-22 DIAGNOSIS — E291 Testicular hypofunction: Secondary | ICD-10-CM

## 2021-10-25 MED ORDER — TESTOSTERONE CYPIONATE 200 MG/ML IM SOLN: INTRAMUSCULAR | 0 refills | Status: DC

## 2021-11-05 ENCOUNTER — Other Ambulatory Visit: Payer: Self-pay

## 2021-11-05 DIAGNOSIS — E291 Testicular hypofunction: Secondary | ICD-10-CM

## 2021-11-06 ENCOUNTER — Other Ambulatory Visit: Payer: BC Managed Care – PPO

## 2021-11-06 DIAGNOSIS — E291 Testicular hypofunction: Secondary | ICD-10-CM

## 2021-11-07 LAB — TESTOSTERONE: Testosterone: 431 ng/dL (ref 264–916)

## 2021-11-07 LAB — HEMATOCRIT: Hematocrit: 44.5 % (ref 37.5–51.0)

## 2021-11-17 ENCOUNTER — Telehealth: Payer: Self-pay | Admitting: *Deleted

## 2021-11-17 NOTE — Telephone Encounter (Signed)
-----   Message from Riki Altes, MD sent at 11/17/2021  3:02 PM EDT ----- ?Testosterone level was 431.  PSA 1.6 and hematocrit normal.  Schedule follow-up office visit November 2023 with testosterone, PSA, hematocrit. ?

## 2021-11-17 NOTE — Telephone Encounter (Signed)
Notified patient as instructed, patient pleased. Discussed follow-up appointments, patient agrees  

## 2021-11-25 ENCOUNTER — Other Ambulatory Visit: Payer: Self-pay | Admitting: Urology

## 2021-11-25 DIAGNOSIS — E291 Testicular hypofunction: Secondary | ICD-10-CM

## 2022-05-07 ENCOUNTER — Other Ambulatory Visit: Payer: Self-pay | Admitting: *Deleted

## 2022-05-07 DIAGNOSIS — E291 Testicular hypofunction: Secondary | ICD-10-CM

## 2022-05-07 DIAGNOSIS — R972 Elevated prostate specific antigen [PSA]: Secondary | ICD-10-CM

## 2022-05-20 ENCOUNTER — Other Ambulatory Visit: Payer: BC Managed Care – PPO

## 2022-05-20 DIAGNOSIS — E291 Testicular hypofunction: Secondary | ICD-10-CM

## 2022-05-20 DIAGNOSIS — R972 Elevated prostate specific antigen [PSA]: Secondary | ICD-10-CM

## 2022-05-21 ENCOUNTER — Ambulatory Visit: Payer: BC Managed Care – PPO | Admitting: Urology

## 2022-05-21 LAB — PSA: Prostate Specific Ag, Serum: 1 ng/mL (ref 0.0–4.0)

## 2022-05-21 LAB — HEMATOCRIT: Hematocrit: 44.4 % (ref 37.5–51.0)

## 2022-05-21 LAB — TESTOSTERONE: Testosterone: 463 ng/dL (ref 264–916)

## 2022-05-26 ENCOUNTER — Ambulatory Visit: Payer: BC Managed Care – PPO | Admitting: Urology

## 2022-05-26 ENCOUNTER — Encounter: Payer: Self-pay | Admitting: Urology

## 2022-05-26 DIAGNOSIS — E291 Testicular hypofunction: Secondary | ICD-10-CM

## 2022-05-26 MED ORDER — "BD LUER-LOK SYRINGE 23G X 1-1/2"" 3 ML MISC": 0 refills | Status: DC

## 2022-05-26 MED ORDER — SILDENAFIL CITRATE 20 MG PO TABS: ORAL_TABLET | ORAL | 3 refills | Status: DC

## 2022-05-26 NOTE — Progress Notes (Signed)
05/26/2022 3:36 PM   Jeffrey Pace Nov 22, 1965 [id number]  Referring provider: Kandyce Rud, MD [phone] [address] [facility] [place],  [place] [address]  Chief Complaint  Patient presents with   Elevated PSA    Urologic history: 1.  Hypogonadism -TRT testosterone cypionate 100 mg every 2 weeks   2.  History gross hematuria 01/2019 -CTU negative for upper tract abnormalities -Cystoscopy BPH with hypervascularity -Cytology negative   3.  History nephrolithiasis -CTU 8/28 neg for calculi  4.  Elevated PSA -Biopsy 09/2020 PSA 5.6  -MRI 31 g gland without suspicious lesions; TRUS 30 g gland -Path all cores benign   HPI: 56 y.o. male presents for semiannual follow-up  No significant problems since last visit Restarted testosterone at 100 mg every 2 weeks Has good energy level and libido No bothersome LUTS Labs 05/20/2022: Testosterone 463; PSA 1.0; HCT 44.4   PMH: Past Medical History:  Diagnosis Date   Diabetes mellitus (HCC)    Hyperlipidemia    Hypertension    Kidney stones    Low testosterone in male     Surgical History: Past Surgical History:  Procedure Laterality Date   broken foot Left 2005   repair with rods   COLONOSCOPY WITH PROPOFOL N/A 01/05/2017   Procedure: COLONOSCOPY WITH PROPOFOL;  Surgeon: Earline Mayotte, MD;  Location: [facility] ENDOSCOPY;  Service: Endoscopy;  Laterality: N/A;    Home Medications:  Allergies as of 05/26/2022       Reactions   Demerol [meperidine] Nausea And Vomiting   Glipizide    Other reaction(s): Muscle Pain Did fine on 2.5 mg of glipizide but didn't tolerate 5 mg        Medication List        Accurate as of May 26, 2022  3:36 PM. If you have any questions, ask your nurse or doctor.          aspirin EC 81 MG tablet Take 81 mg by mouth daily.   atorvastatin 20 MG tablet Commonly known as: LIPITOR Take 20 mg by mouth daily.   B-D 3CC LUER-LOK SYR 23GX1-1/2  23G X 1-1/2" 3 ML Misc Generic drug: SYRINGE-NEEDLE (DISP) 3 ML Use to inject testosterone every 14 days   BD Hypodermic Needle 18G X 1" Misc Generic drug: NEEDLE (DISP) 18 G USE EVERY 14 DAYS AS DIRECTED   glimepiride 2 MG tablet Commonly known as: AMARYL Take by mouth.   hydrochlorothiazide 25 MG tablet Commonly known as: HYDRODIURIL TAKE 1 TABLET BY MOUTH EVERY DAY   metFORMIN 500 MG 24 hr tablet Commonly known as: GLUCOPHAGE-XR TK 2 TS PO D WITH DINNER   olmesartan 40 MG tablet Commonly known as: BENICAR Take by mouth.   sildenafil 20 MG tablet Commonly known as: REVATIO TAKE 3-5 TABLETS BY MOUTH DAILY AS NEEDED   sitaGLIPtin 100 MG tablet Commonly known as: JANUVIA TAKE 1 TABLET(100 MG) BY MOUTH EVERY DAY   testosterone cypionate 200 MG/ML injection Commonly known as: DEPOTESTOSTERONE CYPIONATE ADMINISTER 0.5 ML IN THE MUSCLE EVERY 14 DAYS.  DISCARD REMAINDER OF VIAL   Vitamin D3 250 MCG (10000 UT) Tabs Take by mouth.        Allergies:  Allergies  Allergen Reactions   Demerol [Meperidine] Nausea And Vomiting   Glipizide     Other reaction(s): Muscle Pain Did fine on 2.5 mg of glipizide but didn't tolerate 5 mg    Family History: Family History  Problem Relation Age  of Onset   Lung cancer Mother 38   Breast cancer Mother 72   Hypertension Father    Rectal cancer Maternal Uncle 61    Social History:  reports that he has never smoked. He has never used smokeless tobacco. He reports current alcohol use of about 4.0 standard drinks of alcohol per week. He reports that he does not use drugs.   Physical Exam: BP (!) 145/88   Pulse 96   Ht 5\' 6"  (1.676 m)   Wt 185 lb (83.9 kg)   BMI 29.86 kg/m   Constitutional:  Alert and oriented, No acute distress. HEENT: [place] AT, moist mucus membranes.  Trachea midline, no masses. Cardiovascular: No clubbing, cyanosis, or edema. Respiratory: Normal respiratory effort, no increased work of  breathing.   Assessment & Plan:    1.  History elevated PSA Stable  2.  Hypogonadism Doing well on 100 mg every 14 days Lab visit 6 months testosterone, hematocrit Office visit 1 year testosterone, hematocrit, PSA    , MD  [facility] [address] [place], [place] [phone]

## 2022-05-30 ENCOUNTER — Encounter: Payer: Self-pay | Admitting: Urology

## 2022-07-02 ENCOUNTER — Other Ambulatory Visit: Payer: Self-pay | Admitting: Urology

## 2022-07-02 DIAGNOSIS — E291 Testicular hypofunction: Secondary | ICD-10-CM

## 2022-11-25 ENCOUNTER — Other Ambulatory Visit: Payer: BC Managed Care – PPO

## 2022-11-25 ENCOUNTER — Other Ambulatory Visit: Payer: Self-pay

## 2022-11-25 DIAGNOSIS — R972 Elevated prostate specific antigen [PSA]: Secondary | ICD-10-CM

## 2022-11-25 DIAGNOSIS — E291 Testicular hypofunction: Secondary | ICD-10-CM

## 2022-11-26 LAB — TESTOSTERONE: Testosterone: 373 ng/dL (ref 264–916)

## 2022-11-26 LAB — HEMATOCRIT: Hematocrit: 43.9 % (ref 37.5–51.0)

## 2022-12-21 ENCOUNTER — Other Ambulatory Visit: Payer: Self-pay | Admitting: *Deleted

## 2022-12-21 DIAGNOSIS — E291 Testicular hypofunction: Secondary | ICD-10-CM

## 2022-12-21 NOTE — Telephone Encounter (Signed)
Pt called in on triage line inquiring about his refill of testosterone. Informed pt his refill was sent in earlier today. Pt asked about getting more needles and syringes- states he still has some.  05/26/22 syringes were sent in quantity of 50. Instructed pt to contact his pharmacy to obtain more.  Told pt to call triage line back if he was unable to get more needles and syringes.  Pt. Verbalized understanding

## 2022-12-22 MED ORDER — TESTOSTERONE CYPIONATE 200 MG/ML IM SOLN
INTRAMUSCULAR | 0 refills | Status: DC
Start: 2022-12-22 — End: 2023-06-08

## 2023-05-27 ENCOUNTER — Other Ambulatory Visit: Payer: BC Managed Care – PPO

## 2023-05-27 DIAGNOSIS — R972 Elevated prostate specific antigen [PSA]: Secondary | ICD-10-CM

## 2023-05-27 DIAGNOSIS — E291 Testicular hypofunction: Secondary | ICD-10-CM

## 2023-05-28 LAB — TESTOSTERONE: Testosterone: 312 ng/dL (ref 264–916)

## 2023-05-28 LAB — PSA: Prostate Specific Ag, Serum: 1.1 ng/mL (ref 0.0–4.0)

## 2023-05-28 LAB — HEMATOCRIT: Hematocrit: 48.6 % (ref 37.5–51.0)

## 2023-05-30 ENCOUNTER — Ambulatory Visit: Payer: BC Managed Care – PPO | Admitting: Urology

## 2023-06-08 ENCOUNTER — Ambulatory Visit: Payer: BC Managed Care – PPO | Admitting: Urology

## 2023-06-08 ENCOUNTER — Encounter: Payer: Self-pay | Admitting: Urology

## 2023-06-08 VITALS — BP 168/86 | HR 94 | Ht 66.0 in | Wt 185.0 lb

## 2023-06-08 DIAGNOSIS — N529 Male erectile dysfunction, unspecified: Secondary | ICD-10-CM

## 2023-06-08 DIAGNOSIS — E291 Testicular hypofunction: Secondary | ICD-10-CM | POA: Diagnosis not present

## 2023-06-08 DIAGNOSIS — R972 Elevated prostate specific antigen [PSA]: Secondary | ICD-10-CM

## 2023-06-08 MED ORDER — BD LUER-LOK SYRINGE 23G X 1-1/2" 3 ML MISC: 0 refills | Status: AC

## 2023-06-08 MED ORDER — SILDENAFIL CITRATE 100 MG PO TABS: 100.0000 mg | ORAL_TABLET | Freq: Every day | ORAL | 1 refills | Status: DC | PRN

## 2023-06-08 MED ORDER — TESTOSTERONE CYPIONATE 200 MG/ML IM SOLN: INTRAMUSCULAR | 0 refills | Status: DC

## 2023-06-08 NOTE — Progress Notes (Signed)
I, Maysun Anabel Bene, acting as a scribe for Riki Altes, MD., have documented all relevant documentation on the behalf of Riki Altes, MD, as directed by Riki Altes, MD while in the presence of Riki Altes, MD.  06/08/2023 2:14 PM   Terressa Koyanagi 1965/12/20 [id number]  Referring provider: Kandyce Rud, MD [phone] [address] [facility] [place],  [place] [address]  Chief Complaint  Patient presents with   Elevated PSA   Urologic history: 1.  Hypogonadism TRT testosterone cypionate 100 mg every 2 weeks   2.  History gross hematuria 01/2019 CTU negative for upper tract abnormalities Cystoscopy BPH with hypervascularity Cytology negative   3.  History nephrolithiasis CTU 8/28 neg for calculi   4.  Elevated PSA Biopsy 09/2020 PSA 5.6  MRI 31 g gland without suspicious lesions; TRUS 30 g gland Path all cores benign  HPI: Jeffrey Pace is a 57 y.o. male presents for annual follow-up.  No significant problems since last visit Remains on testosterone 100 mg every 2 weeks.  Has good energy level and libido No bothersome LUTS Labs 05/26/2022 testosterone 312, PSA 1.1, HCT 48.6.  He was due for an injection the day his testosterone was drawn. Remains on tadalafil for ED with good efficacy.   PSA trend   Prostate Specific Ag, Serum  Latest Ref Rng 0.0 - 4.0 ng/mL  08/16/2018 1.7   02/07/2020 3.4   03/26/2020 3.4   08/21/2020 5.6 (H)   04/15/2021 0.8   10/08/2021 1.6   05/20/2022 1.0   05/27/2023 1.1      PMH: Past Medical History:  Diagnosis Date   Diabetes mellitus (HCC)    Hyperlipidemia    Hypertension    Kidney stones    Low testosterone in male     Surgical History: Past Surgical History:  Procedure Laterality Date   broken foot Left 2005   repair with rods   COLONOSCOPY WITH PROPOFOL N/A 01/05/2017   Procedure: COLONOSCOPY WITH PROPOFOL;  Surgeon: Earline Mayotte, MD;  Location: [facility] ENDOSCOPY;   Service: Endoscopy;  Laterality: N/A;    Home Medications:  Allergies as of 06/08/2023       Reactions   Demerol [meperidine] Nausea And Vomiting   Glipizide    Other reaction(s): Muscle Pain Did fine on 2.5 mg of glipizide but didn't tolerate 5 mg        Medication List        Accurate as of June 08, 2023  2:14 PM. If you have any questions, ask your nurse or doctor.          STOP taking these medications    sildenafil 20 MG tablet Commonly known as: REVATIO Stopped by: Riki Altes       TAKE these medications    aspirin EC 81 MG tablet Take 81 mg by mouth daily.   atorvastatin 20 MG tablet Commonly known as: LIPITOR Take 20 mg by mouth daily.   B-D 3CC LUER-LOK SYR 23GX1-1/2 23G X 1-1/2" 3 ML Misc Generic drug: SYRINGE-NEEDLE (DISP) 3 ML Use to inject testosterone every 14 days   BD Hypodermic Needle 18G X 1" Misc Generic drug: NEEDLE (DISP) 18 G USE EVERY 14 DAYS AS DIRECTED   glimepiride 2 MG tablet Commonly known as: AMARYL Take by mouth.   hydrochlorothiazide 25 MG tablet Commonly known as: HYDRODIURIL TAKE 1 TABLET BY MOUTH EVERY DAY   Jardiance 25 MG  Tabs tablet Generic drug: empagliflozin Take 25 mg by mouth daily.   metFORMIN 500 MG 24 hr tablet Commonly known as: GLUCOPHAGE-XR TK 2 TS PO D WITH DINNER   olmesartan 40 MG tablet Commonly known as: BENICAR Take by mouth.   sildenafil 100 MG tablet Commonly known as: VIAGRA Take 1 tablet (100 mg total) by mouth daily as needed for erectile dysfunction. Started by: Riki Altes   sitaGLIPtin 100 MG tablet Commonly known as: JANUVIA TAKE 1 TABLET(100 MG) BY MOUTH EVERY DAY   testosterone cypionate 200 MG/ML injection Commonly known as: DEPOTESTOSTERONE CYPIONATE INJECT 0.5 ML IN THE MUSCLE EVERY 14 DAYS. DISCARD REMAINDER OF VIAL   Vitamin D3 250 MCG (10000 UT) Tabs Take by mouth.        Allergies:  Allergies  Allergen Reactions   Demerol [Meperidine] Nausea  And Vomiting   Glipizide     Other reaction(s): Muscle Pain Did fine on 2.5 mg of glipizide but didn't tolerate 5 mg    Family History: Family History  Problem Relation Age of Onset   Lung cancer Mother 22   Breast cancer Mother 61   Hypertension Father    Rectal cancer Maternal Uncle 61    Social History:  reports that he has never smoked. He has never used smokeless tobacco. He reports current alcohol use of about 4.0 standard drinks of alcohol per week. He reports that he does not use drugs.   Physical Exam: BP (!) 168/86   Pulse 94   Ht 5\' 6"  (1.676 m)   Wt 185 lb (83.9 kg)   BMI 29.86 kg/m   Constitutional:  Alert and oriented, No acute distress. HEENT: [place] AT Respiratory: Normal respiratory effort, no increased work of breathing. Psychiatric: Normal mood and affect.   Assessment & Plan:    1. Hypogonadism Stable Lab visit 6 months testosterone, hematocrit Office visit 1 year testosterone, hematocrit, PSA  2. Erectile dysfunction He is taking 4 20 mg tablets and was interested in sildenafil 100 mg  3. History of elevated PSA Recent PSA remains low and stable.    I have reviewed the above documentation for accuracy and completeness, and I agree with the above.   Riki Altes, MD  [facility] [address] [place], [place] [address] [phone]

## 2023-11-08 ENCOUNTER — Other Ambulatory Visit: Payer: Self-pay | Admitting: Urology

## 2023-11-08 DIAGNOSIS — E291 Testicular hypofunction: Secondary | ICD-10-CM

## 2023-12-06 ENCOUNTER — Other Ambulatory Visit: Payer: Self-pay

## 2023-12-06 DIAGNOSIS — R972 Elevated prostate specific antigen [PSA]: Secondary | ICD-10-CM

## 2023-12-06 DIAGNOSIS — E291 Testicular hypofunction: Secondary | ICD-10-CM

## 2023-12-07 ENCOUNTER — Ambulatory Visit: Payer: Self-pay | Admitting: Urology

## 2023-12-07 LAB — HEMOGLOBIN AND HEMATOCRIT, BLOOD
Hematocrit: 49.4 % (ref 37.5–51.0)
Hemoglobin: 16.4 g/dL (ref 13.0–17.7)

## 2023-12-07 LAB — TESTOSTERONE: Testosterone: 349 ng/dL (ref 264–916)

## 2024-02-14 ENCOUNTER — Encounter: Payer: Self-pay | Admitting: Urology

## 2024-05-11 ENCOUNTER — Other Ambulatory Visit: Payer: Self-pay | Admitting: Urology

## 2024-05-11 DIAGNOSIS — E291 Testicular hypofunction: Secondary | ICD-10-CM

## 2024-06-05 ENCOUNTER — Ambulatory Visit: Admitting: Urology

## 2024-06-07 ENCOUNTER — Ambulatory Visit: Payer: Self-pay | Admitting: Urology

## 2024-06-11 ENCOUNTER — Ambulatory Visit: Admitting: Urology

## 2024-06-11 ENCOUNTER — Encounter: Payer: Self-pay | Admitting: Urology

## 2024-06-11 VITALS — BP 156/83 | HR 79 | Ht 66.0 in | Wt 181.0 lb

## 2024-06-11 DIAGNOSIS — N486 Induration penis plastica: Secondary | ICD-10-CM

## 2024-06-11 DIAGNOSIS — N529 Male erectile dysfunction, unspecified: Secondary | ICD-10-CM | POA: Diagnosis not present

## 2024-06-11 DIAGNOSIS — E291 Testicular hypofunction: Secondary | ICD-10-CM | POA: Diagnosis not present

## 2024-06-11 MED ORDER — TADALAFIL 20 MG PO TABS
ORAL_TABLET | ORAL | 0 refills | Status: AC
Start: 2024-06-11 — End: ?

## 2024-06-11 NOTE — Progress Notes (Signed)
 06/11/2024 10:19 AM   [name] 1966-06-10 [id number]  Referring provider: Diedra Lame, MD [phone] [address] [facility] [place],  [place] [address]  Chief Complaint  Patient presents with   Hypogonadism   Urologic history: 1.  Hypogonadism TRT testosterone  cypionate 100 mg every 2 weeks   2.  History gross hematuria 01/2019 CTU negative for upper tract abnormalities Cystoscopy BPH with hypervascularity Cytology negative   3.  History nephrolithiasis CTU 8/28 neg for calculi   4.  Elevated PSA Biopsy 09/2020 PSA 5.6  MRI 31 g gland without suspicious lesions; TRUS 30 g gland Path all cores benign  HPI: Jeffrey Pace is a 58 y.o. male presents for annual follow-up.  Since last year's visit he has experienced a 9-74-month history of erectile dysfunction.  He has noted curvature ventrally and to the right of the distal shaft and states there is decreased penile tumescence of the distal shaft making satisfactory intercourse difficult His wife recently underwent surgery for a ruptured disc and they have not been sexually active for several weeks Remains on testosterone  100 mg every 2 weeks.  Has good energy level and libido No bothersome LUTS Labs 12/06/2023 testosterone  349, H/H 16.4/49.4 PSA 03/26/2024 was 1.15  PSA trend    Prostate Specific Ag, Serum  Latest Ref Rng 0.0 - 4.0 ng/mL  08/16/2018 1.7   02/07/2020 3.4   03/26/2020 3.4   08/21/2020 5.6 (H)   04/15/2021 0.8   10/08/2021 1.6   05/20/2022 1.0   05/27/2023 1.1   03/26/2024 1.15    PMH: Past Medical History:  Diagnosis Date   Diabetes mellitus (HCC)    Hyperlipidemia    Hypertension    Kidney stones    Low testosterone  in male     Surgical History: Past Surgical History:  Procedure Laterality Date   broken foot Left 2005   repair with rods   COLONOSCOPY WITH PROPOFOL  N/A 01/05/2017   Procedure: COLONOSCOPY WITH PROPOFOL ;  Surgeon: Dessa Reyes ORN, MD;   Location: [facility] ENDOSCOPY;  Service: Endoscopy;  Laterality: N/A;    Home Medications:  Allergies as of 06/11/2024       Reactions   Demerol [meperidine] Nausea And Vomiting   Glipizide    Other reaction(s): Muscle Pain Did fine on 2.5 mg of glipizide but didn't tolerate 5 mg        Medication List        Accurate as of June 11, 2024 10:19 AM. If you have any questions, ask your nurse or doctor.          STOP taking these medications    hydrochlorothiazide 25 MG tablet Commonly known as: HYDRODIURIL   sitaGLIPtin 100 MG tablet Commonly known as: JANUVIA       TAKE these medications    aspirin EC 81 MG tablet Take 81 mg by mouth daily.   atorvastatin 20 MG tablet Commonly known as: LIPITOR Take 20 mg by mouth daily.   B-D 3CC LUER-LOK SYR 23GX1-1/2 23G X 1-1/2 3 ML Misc Generic drug: SYRINGE-NEEDLE (DISP) 3 ML Use to inject testosterone  every 14 days   BD Hypodermic Needle 18G X 1 Misc Generic drug: NEEDLE (DISP) 18 G USE EVERY 14 DAYS AS DIRECTED   glimepiride 2 MG tablet Commonly known as: AMARYL Take by mouth.   Jardiance 25 MG Tabs tablet Generic drug: empagliflozin Take 25 mg by mouth daily.   metFORMIN 500 MG 24 hr tablet Commonly  known as: GLUCOPHAGE-XR 1,000 mg.   olmesartan 40 MG tablet Commonly known as: BENICAR Take 20 mg by mouth.   sildenafil  100 MG tablet Commonly known as: VIAGRA  Take 1 tablet (100 mg total) by mouth daily as needed for erectile dysfunction.   testosterone  cypionate 200 MG/ML injection Commonly known as: DEPOTESTOSTERONE CYPIONATE ADMINISTER 0.5ML INTO THE MUSCLE EVERY 14 DAYS   Vitamin D3 250 MCG (10000 UT) Tabs Take by mouth.        Allergies:  Allergies  Allergen Reactions   Demerol [Meperidine] Nausea And Vomiting   Glipizide     Other reaction(s): Muscle Pain Did fine on 2.5 mg of glipizide but didn't tolerate 5 mg    Family History: Family History  Problem Relation Age of Onset    Lung cancer Mother 52   Breast cancer Mother 67   Hypertension Father    Rectal cancer Maternal Uncle 10    Social History:  reports that he has never smoked. He has never used smokeless tobacco. He reports current alcohol use of about 4.0 standard drinks of alcohol per week. He reports that he does not use drugs.   Physical Exam: BP (!) 156/83   Pulse 79   Ht 5' 6 (1.676 m)   Wt 181 lb (82.1 kg)   BMI 29.21 kg/m   Constitutional:  Alert, No acute distress. HEENT: [place] AT Respiratory: Normal respiratory effort, no increased work of breathing. GU: Bilateral corporal plaques distal shaft laterally Psychiatric: Normal mood and affect.   Assessment & Plan:    1.  Peyronie's disease New diagnosis Experiencing worsening ED with decreased tumescence distal shaft which may be secondary to venoocclusive disease Recommended an appointment Dr. Lovie at [facility] for further evaluation  2.  Erectile dysfunction He is experiencing increased nasal stuffiness/flushing with sildenafil  100 mg and inquired about other alternatives. Trial tadalafil  20 mg 1-2 tabs 1 hour prior to sexual activity  3.  Hypogonadism Testosterone , H/H drawn today Lab visit 2-month testosterone , H/H Continue annual follow-up  4.  History elevated PSA PSA September 2025 was stable   Glendia JAYSON Barba, MD  [facility] [address], [address] [place], [place] [address] [phone]

## 2024-06-12 ENCOUNTER — Ambulatory Visit: Payer: Self-pay | Admitting: Urology

## 2024-06-12 LAB — TESTOSTERONE: Testosterone: 62 ng/dL — ABNORMAL LOW (ref 264–916)

## 2024-06-12 LAB — HEMOGLOBIN AND HEMATOCRIT, BLOOD
Hematocrit: 45.8 % (ref 37.5–51.0)
Hemoglobin: 15.5 g/dL (ref 13.0–17.7)

## 2024-12-12 ENCOUNTER — Other Ambulatory Visit
